# Patient Record
Sex: Male | Born: 1975
Health system: Southern US, Community
[De-identification: ages and names within clinical notes are randomized; demographics above are authoritative.]

## PROBLEM LIST (undated history)

## (undated) DIAGNOSIS — K649 Unspecified hemorrhoids: Secondary | ICD-10-CM

## (undated) DIAGNOSIS — F419 Anxiety disorder, unspecified: Secondary | ICD-10-CM

## (undated) HISTORY — DX: Unspecified hemorrhoids: K64.9

## (undated) HISTORY — DX: Anxiety disorder, unspecified: F41.9

---

## 2006-03-27 ENCOUNTER — Emergency Department (HOSPITAL_COMMUNITY): Admission: EM | Admit: 2006-03-27 | Discharge: 2006-03-27 | Payer: Self-pay | Admitting: Emergency Medicine

## 2012-08-16 ENCOUNTER — Encounter (INDEPENDENT_AMBULATORY_CARE_PROVIDER_SITE_OTHER): Payer: Self-pay | Admitting: General Surgery

## 2012-09-05 ENCOUNTER — Ambulatory Visit (INDEPENDENT_AMBULATORY_CARE_PROVIDER_SITE_OTHER): Payer: BC Managed Care – PPO | Admitting: General Surgery

## 2012-09-08 ENCOUNTER — Ambulatory Visit (INDEPENDENT_AMBULATORY_CARE_PROVIDER_SITE_OTHER): Payer: BC Managed Care – PPO | Admitting: General Surgery

## 2012-09-08 ENCOUNTER — Encounter (INDEPENDENT_AMBULATORY_CARE_PROVIDER_SITE_OTHER): Payer: Self-pay | Admitting: General Surgery

## 2012-09-08 VITALS — BP 119/88 | HR 86 | Temp 97.6°F | Resp 16 | Ht 75.0 in | Wt 217.0 lb

## 2012-09-08 DIAGNOSIS — L290 Pruritus ani: Secondary | ICD-10-CM

## 2012-09-08 NOTE — Patient Instructions (Signed)
Use hydrocortisone cream followed by aquaphor 2-3 times a day to rectum. Clean with baby wipes after bowel movements and reapply

## 2012-10-02 ENCOUNTER — Encounter (INDEPENDENT_AMBULATORY_CARE_PROVIDER_SITE_OTHER): Payer: BC Managed Care – PPO | Admitting: General Surgery

## 2012-10-09 NOTE — Progress Notes (Signed)
Subjective:     Patient ID: Marcus Marshall, male   DOB: Jun 14, 1976, 36 y.o.   MRN: [id number]  HPI We are asked to see the patient in consultation by Dr. Eula Listen to evaluate Marcus Marshall for hemorrhoids. The pt is a 36 yo wm who has been having rectal irritation for the last 2 years. He states that is comes and goes. He has noticed a lot of itching and burning and some occasional bleeding when he wipes.  Review of Systems  Constitutional: Negative.   HENT: Negative.   Eyes: Negative.   Respiratory: Negative.   Cardiovascular: Negative.   Gastrointestinal: Positive for anal bleeding and rectal pain.  Genitourinary: Negative.   Musculoskeletal: Negative.   Skin: Negative.   Neurological: Negative.   Hematological: Negative.   Psychiatric/Behavioral: Negative.        Objective:   Physical Exam  Constitutional: He is oriented to person, place, and time. He appears well-developed and well-nourished.  HENT:  Head: Normocephalic and atraumatic.  Eyes: Conjunctivae normal and EOM are normal. Pupils are equal, round, and reactive to light.  Neck: Normal range of motion. Neck supple.  Cardiovascular: Normal rate, regular rhythm and normal heart sounds.   Pulmonary/Chest: Effort normal and breath sounds normal.  Abdominal: Soft. Bowel sounds are normal.  Genitourinary:       He has mild external hemorrhoidal tissue but he does have significant redness and irritation of the perianal skin  Musculoskeletal: Normal range of motion.  Neurological: He is alert and oriented to person, place, and time.  Skin: Skin is warm and dry.  Psychiatric: He has a normal mood and affect. His behavior is normal.       Assessment:     The pt has what appears to be pruritis ani    Plan:     I would recommend using a steroid cream to the perianal skin 3 times a day. I would try to avoid a lot of scrubbing or itching of the rectum. I would also use baby wipes after bm's and reapply cream after cleaning

## 2014-08-13 ENCOUNTER — Other Ambulatory Visit: Payer: Self-pay | Admitting: Internal Medicine

## 2014-08-13 ENCOUNTER — Ambulatory Visit
Admission: RE | Admit: 2014-08-13 | Discharge: 2014-08-13 | Disposition: A | Discharge status: Home / Self Care | Payer: BC Managed Care – PPO | Source: Ambulatory Visit | Attending: Internal Medicine | Admitting: Internal Medicine

## 2014-08-13 DIAGNOSIS — M549 Dorsalgia, unspecified: Secondary | ICD-10-CM

## 2014-08-13 DIAGNOSIS — N50819 Testicular pain, unspecified: Secondary | ICD-10-CM

## 2014-08-19 ENCOUNTER — Ambulatory Visit
Admission: RE | Admit: 2014-08-19 | Discharge: 2014-08-19 | Disposition: A | Discharge status: Home / Self Care | Payer: BC Managed Care – PPO | Source: Ambulatory Visit | Attending: Internal Medicine | Admitting: Internal Medicine

## 2014-08-19 ENCOUNTER — Other Ambulatory Visit: Payer: Self-pay

## 2014-08-19 DIAGNOSIS — N50819 Testicular pain, unspecified: Secondary | ICD-10-CM

## 2016-03-24 DIAGNOSIS — Z113 Encounter for screening for infections with a predominantly sexual mode of transmission: Secondary | ICD-10-CM | POA: Diagnosis not present

## 2016-03-24 DIAGNOSIS — N419 Inflammatory disease of prostate, unspecified: Secondary | ICD-10-CM | POA: Diagnosis not present

## 2016-03-24 DIAGNOSIS — K602 Anal fissure, unspecified: Secondary | ICD-10-CM | POA: Diagnosis not present

## 2016-03-24 DIAGNOSIS — K6289 Other specified diseases of anus and rectum: Secondary | ICD-10-CM | POA: Diagnosis not present

## 2016-06-14 DIAGNOSIS — F4320 Adjustment disorder, unspecified: Secondary | ICD-10-CM | POA: Diagnosis not present

## 2016-06-29 DIAGNOSIS — F4320 Adjustment disorder, unspecified: Secondary | ICD-10-CM | POA: Diagnosis not present

## 2016-07-13 DIAGNOSIS — F4322 Adjustment disorder with anxiety: Secondary | ICD-10-CM | POA: Diagnosis not present

## 2016-07-22 DIAGNOSIS — F4322 Adjustment disorder with anxiety: Secondary | ICD-10-CM | POA: Diagnosis not present

## 2016-07-29 DIAGNOSIS — F4322 Adjustment disorder with anxiety: Secondary | ICD-10-CM | POA: Diagnosis not present

## 2016-08-04 DIAGNOSIS — F4322 Adjustment disorder with anxiety: Secondary | ICD-10-CM | POA: Diagnosis not present

## 2016-08-31 DIAGNOSIS — F4322 Adjustment disorder with anxiety: Secondary | ICD-10-CM | POA: Diagnosis not present

## 2016-09-02 DIAGNOSIS — F4322 Adjustment disorder with anxiety: Secondary | ICD-10-CM | POA: Diagnosis not present

## 2016-09-28 DIAGNOSIS — F4322 Adjustment disorder with anxiety: Secondary | ICD-10-CM | POA: Diagnosis not present

## 2016-11-01 DIAGNOSIS — F419 Anxiety disorder, unspecified: Secondary | ICD-10-CM | POA: Diagnosis not present

## 2016-11-01 DIAGNOSIS — K649 Unspecified hemorrhoids: Secondary | ICD-10-CM | POA: Diagnosis not present

## 2016-11-01 DIAGNOSIS — J019 Acute sinusitis, unspecified: Secondary | ICD-10-CM | POA: Diagnosis not present

## 2016-11-05 DIAGNOSIS — K602 Anal fissure, unspecified: Secondary | ICD-10-CM | POA: Diagnosis not present

## 2017-01-20 DIAGNOSIS — F4322 Adjustment disorder with anxiety: Secondary | ICD-10-CM | POA: Diagnosis not present

## 2017-01-25 DIAGNOSIS — F4322 Adjustment disorder with anxiety: Secondary | ICD-10-CM | POA: Diagnosis not present

## 2017-01-27 DIAGNOSIS — F1010 Alcohol abuse, uncomplicated: Secondary | ICD-10-CM | POA: Diagnosis not present

## 2017-01-27 DIAGNOSIS — F4322 Adjustment disorder with anxiety: Secondary | ICD-10-CM | POA: Diagnosis not present

## 2017-02-03 DIAGNOSIS — F4322 Adjustment disorder with anxiety: Secondary | ICD-10-CM | POA: Diagnosis not present

## 2017-02-15 DIAGNOSIS — F4322 Adjustment disorder with anxiety: Secondary | ICD-10-CM | POA: Diagnosis not present

## 2017-03-03 DIAGNOSIS — F4322 Adjustment disorder with anxiety: Secondary | ICD-10-CM | POA: Diagnosis not present

## 2017-03-29 DIAGNOSIS — F4322 Adjustment disorder with anxiety: Secondary | ICD-10-CM | POA: Diagnosis not present

## 2017-05-31 DIAGNOSIS — F411 Generalized anxiety disorder: Secondary | ICD-10-CM | POA: Diagnosis not present

## 2017-06-01 DIAGNOSIS — F411 Generalized anxiety disorder: Secondary | ICD-10-CM | POA: Diagnosis not present

## 2017-06-01 DIAGNOSIS — H532 Diplopia: Secondary | ICD-10-CM | POA: Diagnosis not present

## 2017-06-01 DIAGNOSIS — H5203 Hypermetropia, bilateral: Secondary | ICD-10-CM | POA: Diagnosis not present

## 2017-06-01 DIAGNOSIS — H4910 Fourth [trochlear] nerve palsy, unspecified eye: Secondary | ICD-10-CM | POA: Diagnosis not present

## 2017-06-01 DIAGNOSIS — N529 Male erectile dysfunction, unspecified: Secondary | ICD-10-CM | POA: Diagnosis not present

## 2017-06-07 DIAGNOSIS — H4911 Fourth [trochlear] nerve palsy, right eye: Secondary | ICD-10-CM | POA: Diagnosis not present

## 2017-06-07 DIAGNOSIS — H532 Diplopia: Secondary | ICD-10-CM | POA: Diagnosis not present

## 2017-06-29 DIAGNOSIS — F411 Generalized anxiety disorder: Secondary | ICD-10-CM | POA: Diagnosis not present

## 2017-06-29 DIAGNOSIS — F1010 Alcohol abuse, uncomplicated: Secondary | ICD-10-CM | POA: Diagnosis not present

## 2017-07-29 DIAGNOSIS — E7800 Pure hypercholesterolemia, unspecified: Secondary | ICD-10-CM | POA: Diagnosis not present

## 2017-07-29 DIAGNOSIS — Z0000 Encounter for general adult medical examination without abnormal findings: Secondary | ICD-10-CM | POA: Diagnosis not present

## 2017-07-29 DIAGNOSIS — Z125 Encounter for screening for malignant neoplasm of prostate: Secondary | ICD-10-CM | POA: Diagnosis not present

## 2017-08-04 DIAGNOSIS — F411 Generalized anxiety disorder: Secondary | ICD-10-CM | POA: Diagnosis not present

## 2017-08-04 DIAGNOSIS — F1010 Alcohol abuse, uncomplicated: Secondary | ICD-10-CM | POA: Diagnosis not present

## 2017-08-09 DIAGNOSIS — F411 Generalized anxiety disorder: Secondary | ICD-10-CM | POA: Diagnosis not present

## 2017-08-09 DIAGNOSIS — F1010 Alcohol abuse, uncomplicated: Secondary | ICD-10-CM | POA: Diagnosis not present

## 2017-12-06 DIAGNOSIS — J069 Acute upper respiratory infection, unspecified: Secondary | ICD-10-CM | POA: Diagnosis not present

## 2017-12-06 DIAGNOSIS — F17210 Nicotine dependence, cigarettes, uncomplicated: Secondary | ICD-10-CM | POA: Diagnosis not present

## 2018-02-01 DIAGNOSIS — Z113 Encounter for screening for infections with a predominantly sexual mode of transmission: Secondary | ICD-10-CM | POA: Diagnosis not present

## 2018-05-22 DIAGNOSIS — K529 Noninfective gastroenteritis and colitis, unspecified: Secondary | ICD-10-CM | POA: Diagnosis not present

## 2018-05-23 DIAGNOSIS — K529 Noninfective gastroenteritis and colitis, unspecified: Secondary | ICD-10-CM | POA: Diagnosis not present

## 2018-09-13 DIAGNOSIS — F411 Generalized anxiety disorder: Secondary | ICD-10-CM | POA: Diagnosis not present

## 2018-09-13 DIAGNOSIS — F1010 Alcohol abuse, uncomplicated: Secondary | ICD-10-CM | POA: Diagnosis not present

## 2018-10-26 DIAGNOSIS — Z1389 Encounter for screening for other disorder: Secondary | ICD-10-CM | POA: Diagnosis not present

## 2018-10-26 DIAGNOSIS — F17210 Nicotine dependence, cigarettes, uncomplicated: Secondary | ICD-10-CM | POA: Diagnosis not present

## 2018-10-26 DIAGNOSIS — Z0000 Encounter for general adult medical examination without abnormal findings: Secondary | ICD-10-CM | POA: Diagnosis not present

## 2018-10-26 DIAGNOSIS — F411 Generalized anxiety disorder: Secondary | ICD-10-CM | POA: Diagnosis not present

## 2018-10-26 DIAGNOSIS — Z13220 Encounter for screening for lipoid disorders: Secondary | ICD-10-CM | POA: Diagnosis not present

## 2018-11-02 DIAGNOSIS — F411 Generalized anxiety disorder: Secondary | ICD-10-CM | POA: Diagnosis not present

## 2018-11-02 DIAGNOSIS — E7800 Pure hypercholesterolemia, unspecified: Secondary | ICD-10-CM | POA: Diagnosis not present

## 2018-11-29 DIAGNOSIS — F1010 Alcohol abuse, uncomplicated: Secondary | ICD-10-CM | POA: Diagnosis not present

## 2018-11-29 DIAGNOSIS — F411 Generalized anxiety disorder: Secondary | ICD-10-CM | POA: Diagnosis not present

## 2019-01-12 DIAGNOSIS — R197 Diarrhea, unspecified: Secondary | ICD-10-CM | POA: Diagnosis not present

## 2019-01-12 DIAGNOSIS — Z1159 Encounter for screening for other viral diseases: Secondary | ICD-10-CM | POA: Diagnosis not present

## 2019-01-12 DIAGNOSIS — J029 Acute pharyngitis, unspecified: Secondary | ICD-10-CM | POA: Diagnosis not present

## 2019-01-12 DIAGNOSIS — M791 Myalgia, unspecified site: Secondary | ICD-10-CM | POA: Diagnosis not present

## 2019-02-07 DIAGNOSIS — R5383 Other fatigue: Secondary | ICD-10-CM | POA: Diagnosis not present

## 2019-05-01 DIAGNOSIS — E7800 Pure hypercholesterolemia, unspecified: Secondary | ICD-10-CM | POA: Diagnosis not present

## 2020-02-06 ENCOUNTER — Ambulatory Visit: Payer: Self-pay | Attending: Internal Medicine

## 2020-02-06 DIAGNOSIS — R509 Fever, unspecified: Secondary | ICD-10-CM | POA: Diagnosis not present

## 2020-02-06 DIAGNOSIS — J029 Acute pharyngitis, unspecified: Secondary | ICD-10-CM | POA: Diagnosis not present

## 2020-02-06 DIAGNOSIS — R05 Cough: Secondary | ICD-10-CM | POA: Diagnosis not present

## 2020-02-06 DIAGNOSIS — Z20822 Contact with and (suspected) exposure to covid-19: Secondary | ICD-10-CM | POA: Insufficient documentation

## 2020-02-07 LAB — NOVEL CORONAVIRUS, NAA: SARS-CoV-2, NAA: NOT DETECTED

## 2020-03-26 DIAGNOSIS — J060 Acute laryngopharyngitis: Secondary | ICD-10-CM | POA: Diagnosis not present

## 2020-03-26 DIAGNOSIS — R509 Fever, unspecified: Secondary | ICD-10-CM | POA: Diagnosis not present

## 2020-03-27 DIAGNOSIS — Z1152 Encounter for screening for COVID-19: Secondary | ICD-10-CM | POA: Diagnosis not present

## 2020-04-22 ENCOUNTER — Ambulatory Visit
Admission: RE | Admit: 2020-04-22 | Discharge: 2020-04-22 | Disposition: A | Discharge status: Home / Self Care | Payer: BLUE CROSS/BLUE SHIELD | Source: Ambulatory Visit | Attending: Physician Assistant | Admitting: Physician Assistant

## 2020-04-22 ENCOUNTER — Other Ambulatory Visit: Payer: Self-pay

## 2020-04-22 ENCOUNTER — Other Ambulatory Visit: Payer: Self-pay | Admitting: Physician Assistant

## 2020-04-22 DIAGNOSIS — R0781 Pleurodynia: Secondary | ICD-10-CM

## 2020-04-22 DIAGNOSIS — R079 Chest pain, unspecified: Secondary | ICD-10-CM | POA: Diagnosis not present

## 2020-04-22 DIAGNOSIS — R000 Tachycardia, unspecified: Secondary | ICD-10-CM | POA: Diagnosis not present

## 2020-04-24 ENCOUNTER — Emergency Department (HOSPITAL_COMMUNITY): Payer: BLUE CROSS/BLUE SHIELD

## 2020-04-24 ENCOUNTER — Inpatient Hospital Stay (HOSPITAL_COMMUNITY)
Admission: EM | Admit: 2020-04-24 | Discharge: 2020-04-26 | DRG: 176 | Disposition: A | Discharge status: Home / Self Care | Payer: BLUE CROSS/BLUE SHIELD | Attending: Internal Medicine | Admitting: Internal Medicine

## 2020-04-24 ENCOUNTER — Other Ambulatory Visit: Payer: Self-pay

## 2020-04-24 ENCOUNTER — Encounter (HOSPITAL_COMMUNITY): Payer: Self-pay

## 2020-04-24 DIAGNOSIS — I358 Other nonrheumatic aortic valve disorders: Secondary | ICD-10-CM | POA: Diagnosis not present

## 2020-04-24 DIAGNOSIS — F419 Anxiety disorder, unspecified: Secondary | ICD-10-CM | POA: Diagnosis not present

## 2020-04-24 DIAGNOSIS — J90 Pleural effusion, not elsewhere classified: Secondary | ICD-10-CM | POA: Diagnosis present

## 2020-04-24 DIAGNOSIS — Z79899 Other long term (current) drug therapy: Secondary | ICD-10-CM

## 2020-04-24 DIAGNOSIS — Z8249 Family history of ischemic heart disease and other diseases of the circulatory system: Secondary | ICD-10-CM | POA: Diagnosis not present

## 2020-04-24 DIAGNOSIS — I2699 Other pulmonary embolism without acute cor pulmonale: Principal | ICD-10-CM | POA: Diagnosis present

## 2020-04-24 DIAGNOSIS — I2602 Saddle embolus of pulmonary artery with acute cor pulmonale: Secondary | ICD-10-CM | POA: Diagnosis not present

## 2020-04-24 DIAGNOSIS — Z20822 Contact with and (suspected) exposure to covid-19: Secondary | ICD-10-CM | POA: Diagnosis present

## 2020-04-24 DIAGNOSIS — Z87891 Personal history of nicotine dependence: Secondary | ICD-10-CM | POA: Diagnosis not present

## 2020-04-24 DIAGNOSIS — R079 Chest pain, unspecified: Secondary | ICD-10-CM | POA: Diagnosis not present

## 2020-04-24 DIAGNOSIS — B948 Sequelae of other specified infectious and parasitic diseases: Secondary | ICD-10-CM | POA: Diagnosis not present

## 2020-04-24 DIAGNOSIS — Z114 Encounter for screening for human immunodeficiency virus [HIV]: Secondary | ICD-10-CM

## 2020-04-24 LAB — CBC
HCT: 46.2 % (ref 39.0–52.0)
Hemoglobin: 16.5 g/dL (ref 13.0–17.0)
MCH: 32.2 pg (ref 26.0–34.0)
MCHC: 35.7 g/dL (ref 30.0–36.0)
MCV: 90.1 fL (ref 80.0–100.0)
Platelets: 235 10*3/uL (ref 150–400)
RBC: 5.13 MIL/uL (ref 4.22–5.81)
RDW: 11.4 % — ABNORMAL LOW (ref 11.5–15.5)
WBC: 8.9 10*3/uL (ref 4.0–10.5)
nRBC: 0 % (ref 0.0–0.2)

## 2020-04-24 LAB — BASIC METABOLIC PANEL
Anion gap: 16 — ABNORMAL HIGH (ref 5–15)
BUN: 14 mg/dL (ref 6–20)
CO2: 24 mmol/L (ref 22–32)
Calcium: 9 mg/dL (ref 8.9–10.3)
Chloride: 97 mmol/L — ABNORMAL LOW (ref 98–111)
Creatinine, Ser: 1.13 mg/dL (ref 0.61–1.24)
GFR calc Af Amer: >60 mL/min (ref >60)
GFR calc non Af Amer: >60 mL/min (ref >60)
Glucose, Bld: 108 mg/dL — ABNORMAL HIGH (ref 70–99)
Potassium: 3.9 mmol/L (ref 3.5–5.1)
Sodium: 137 mmol/L (ref 135–145)

## 2020-04-24 LAB — TROPONIN I (HIGH SENSITIVITY): Troponin I (High Sensitivity): <2 ng/L (ref <18)

## 2020-04-24 MED ORDER — SODIUM CHLORIDE 0.9% FLUSH: 3.0000 mL | Freq: Once | INTRAVENOUS | Status: DC

## 2020-04-24 MED ORDER — IOHEXOL 350 MG/ML SOLN
75.0000 mL | Freq: Once | INTRAVENOUS | Status: AC | PRN
  Administered 2020-04-24: 75 mL via INTRAVENOUS

## 2020-04-24 NOTE — ED Triage Notes (Signed)
Pt arrives to ED w/ c/o 7/10 R sided chest pain and sob since Monday. Pt reports coughing up blood over the last few days.

## 2020-04-25 ENCOUNTER — Inpatient Hospital Stay (HOSPITAL_COMMUNITY): Payer: BLUE CROSS/BLUE SHIELD

## 2020-04-25 ENCOUNTER — Encounter (HOSPITAL_COMMUNITY): Payer: Self-pay | Admitting: Internal Medicine

## 2020-04-25 ENCOUNTER — Other Ambulatory Visit: Payer: Self-pay

## 2020-04-25 DIAGNOSIS — I358 Other nonrheumatic aortic valve disorders: Secondary | ICD-10-CM | POA: Diagnosis present

## 2020-04-25 DIAGNOSIS — I2602 Saddle embolus of pulmonary artery with acute cor pulmonale: Secondary | ICD-10-CM

## 2020-04-25 DIAGNOSIS — Z20822 Contact with and (suspected) exposure to covid-19: Secondary | ICD-10-CM | POA: Diagnosis present

## 2020-04-25 DIAGNOSIS — Z79899 Other long term (current) drug therapy: Secondary | ICD-10-CM | POA: Diagnosis not present

## 2020-04-25 DIAGNOSIS — Z87891 Personal history of nicotine dependence: Secondary | ICD-10-CM | POA: Diagnosis not present

## 2020-04-25 DIAGNOSIS — I2699 Other pulmonary embolism without acute cor pulmonale: Secondary | ICD-10-CM | POA: Diagnosis present

## 2020-04-25 DIAGNOSIS — B948 Sequelae of other specified infectious and parasitic diseases: Secondary | ICD-10-CM | POA: Diagnosis not present

## 2020-04-25 DIAGNOSIS — J90 Pleural effusion, not elsewhere classified: Secondary | ICD-10-CM | POA: Diagnosis present

## 2020-04-25 DIAGNOSIS — F419 Anxiety disorder, unspecified: Secondary | ICD-10-CM | POA: Diagnosis present

## 2020-04-25 DIAGNOSIS — Z8249 Family history of ischemic heart disease and other diseases of the circulatory system: Secondary | ICD-10-CM | POA: Diagnosis not present

## 2020-04-25 DIAGNOSIS — Z114 Encounter for screening for human immunodeficiency virus [HIV]: Secondary | ICD-10-CM

## 2020-04-25 LAB — HEPARIN LEVEL (UNFRACTIONATED)
Heparin Unfractionated: 0.41 IU/mL (ref 0.30–0.70)
Heparin Unfractionated: 0.41 IU/mL (ref 0.30–0.70)

## 2020-04-25 LAB — CBC
HCT: 45.5 % (ref 39.0–52.0)
Hemoglobin: 16.4 g/dL (ref 13.0–17.0)
MCH: 32.3 pg (ref 26.0–34.0)
MCHC: 36 g/dL (ref 30.0–36.0)
MCV: 89.6 fL (ref 80.0–100.0)
Platelets: 231 10*3/uL (ref 150–400)
RBC: 5.08 MIL/uL (ref 4.22–5.81)
RDW: 11.5 % (ref 11.5–15.5)
WBC: 8.1 10*3/uL (ref 4.0–10.5)
nRBC: 0 % (ref 0.0–0.2)

## 2020-04-25 LAB — SARS CORONAVIRUS 2 BY RT PCR (HOSPITAL ORDER, PERFORMED IN ~~LOC~~ HOSPITAL LAB): SARS Coronavirus 2: NEGATIVE

## 2020-04-25 LAB — ECHOCARDIOGRAM COMPLETE
Height: 76 in
Weight: 3680 oz

## 2020-04-25 LAB — HIV ANTIBODY (ROUTINE TESTING W REFLEX): HIV Screen 4th Generation wRfx: NONREACTIVE

## 2020-04-25 LAB — TROPONIN I (HIGH SENSITIVITY): Troponin I (High Sensitivity): <2 ng/L (ref <18)

## 2020-04-25 LAB — BRAIN NATRIURETIC PEPTIDE: B Natriuretic Peptide: 31.8 pg/mL (ref 0.0–100.0)

## 2020-04-25 MED ORDER — ONDANSETRON HCL 4 MG/2ML IJ SOLN
4.0000 mg | Freq: Once | INTRAMUSCULAR | Status: AC
  Administered 2020-04-25: 4 mg via INTRAVENOUS
  Filled 2020-04-25: qty 2

## 2020-04-25 MED ORDER — MORPHINE SULFATE (PF) 4 MG/ML IV SOLN
4.0000 mg | Freq: Once | INTRAVENOUS | Status: AC
  Administered 2020-04-25: 4 mg via INTRAVENOUS
  Filled 2020-04-25: qty 1

## 2020-04-25 MED ORDER — HEPARIN BOLUS VIA INFUSION
7000.0000 [IU] | Freq: Once | INTRAVENOUS | Status: AC
  Administered 2020-04-25: 7000 [IU] via INTRAVENOUS
  Filled 2020-04-25: qty 7000

## 2020-04-25 MED ORDER — GUAIFENESIN-CODEINE 100-10 MG/5ML PO SOLN
5.0000 mL | ORAL | Status: DC | PRN
  Administered 2020-04-26: 5 mL via ORAL
  Filled 2020-04-25 (×2): qty 5

## 2020-04-25 MED ORDER — MORPHINE SULFATE (PF) 2 MG/ML IV SOLN
1.0000 mg | INTRAVENOUS | Status: DC | PRN
  Administered 2020-04-25: 1 mg via INTRAVENOUS
  Filled 2020-04-25: qty 1

## 2020-04-25 MED ORDER — KETOROLAC TROMETHAMINE 30 MG/ML IJ SOLN
30.0000 mg | Freq: Four times a day (QID) | INTRAMUSCULAR | Status: DC | PRN
  Administered 2020-04-25 – 2020-04-26 (×3): 30 mg via INTRAVENOUS
  Filled 2020-04-25 (×3): qty 1

## 2020-04-25 MED ORDER — ACETAMINOPHEN 650 MG RE SUPP: 650.0000 mg | Freq: Four times a day (QID) | RECTAL | Status: DC | PRN

## 2020-04-25 MED ORDER — ACETAMINOPHEN 325 MG PO TABS: 650.0000 mg | ORAL_TABLET | Freq: Four times a day (QID) | ORAL | Status: DC | PRN

## 2020-04-25 MED ORDER — MORPHINE SULFATE (PF) 2 MG/ML IV SOLN
2.0000 mg | INTRAVENOUS | Status: DC | PRN
  Administered 2020-04-25 (×2): 2 mg via INTRAVENOUS
  Filled 2020-04-25 (×2): qty 1

## 2020-04-25 MED ORDER — HEPARIN (PORCINE) 25000 UT/250ML-% IV SOLN
1950.0000 [IU]/h | INTRAVENOUS | Status: DC
  Administered 2020-04-25: 1900 [IU]/h via INTRAVENOUS
  Administered 2020-04-25: 1850 [IU]/h via INTRAVENOUS
  Filled 2020-04-25 (×3): qty 250

## 2020-04-25 NOTE — ED Provider Notes (Signed)
Patient is 44 year old male presented today with chest pain shortness of breath.  I was called by triage nurse who is requesting CT PE study.  After discussion with nurse and review of patient's lab work and vital signs I ordered CT PE from waiting room. I discussed this with Dr. Dalene Seltzer who recommended PE study be ordered.   12:18 AM called by radiology who informed that patient has bilateral PEs with evidence of right heart strain.  Discussed with [facility].  Patient will be brought back to room as next patient.     Solon Augusta [place], [place] 04/25/20 4332    Alvira Monday, MD 04/25/20 1329

## 2020-04-25 NOTE — Progress Notes (Signed)
°  Echocardiogram 2D Echocardiogram has been performed.  Marcus Marshall 04/25/2020, 4:36 PM

## 2020-04-25 NOTE — ED Notes (Signed)
Lunch Tray Ordered @ 1048. °

## 2020-04-25 NOTE — ED Notes (Signed)
Pt requesting copy of CT scan. CT notified of pt request.

## 2020-04-25 NOTE — TOC Benefit Eligibility Note (Signed)
Transition of Care [facility]) Benefit Eligibility Note    Patient Details  Name: Marcus Marshall MRN: [id number] Date of Birth: 1975-12-31   Medication/Dose: Carlena Hurl  15 MG BID  CO-PAY- $35.00   XARELTO 20 MG DAILY  CO-PAY-$35.00  (  RIVAROXABAN: NON-FORMULARY  )  Covered?: Yes  Tier: 2 Drug  Prescription Coverage [facility]erred [facility]acy: [facility]  and   [facility]  M/O    ( 90 DAY SUPPLY FOR RETAIL OR M/O $105.00  )  Spoke with Person/Company/Phone Number:: RHONDA  @ [employer] RX # [phone]  Co-Pay: $35.00  Prior Approval: No  Deductible: (NO DEDUCTIBLE and NO OUT-OF-POCKET)  Additional Notes: ELIQUIS  2.5 MG BID  CO-PAY-$35.00   ELIQUIS 5 MG BID CO-PAY- $35.00        APIXABAN   and  ELIQUIS 10 MG BID- NON-FORMULARY    Mardene Sayer Phone Number: 04/25/2020, 4:45 PM

## 2020-04-25 NOTE — ED Provider Notes (Signed)
[facility] EMERGENCY DEPARTMENT Provider Note   CSN: [id number] Arrival date & time: 04/24/20  2113     History Chief Complaint  Patient presents with  . Chest Pain    Marcus Marshall is a 44 y.o. male.  Patient presents to the emergency department for evaluation of chest pain.  Patient reports that he was diagnosed with Covid on May 9.  He reports that he has had a rough course with the Covid.  He saw his primary doctor earlier this week because he was experiencing chest pain and cough.  He had an outpatient x-ray that showed an effusion.  He was given pain medication for the chest pain and this has not helped.  Pain worsened tonight he has noticed that he is coughing up blood so he presents to the ER.         Past Medical History:  Diagnosis Date  . Anxiety   . Hemorrhoids     Patient Active Problem List   Diagnosis Date Noted  . Pulmonary embolism ([place]) 04/25/2020  . Pruritus ani 09/08/2012    History reviewed. No pertinent surgical history.     No family history on file.  Social History   Tobacco Use  . Smoking status: Former Smoker  Substance Use Topics  . Alcohol use: Yes    Comment: 6 pack per week  . Drug use: No    Home Medications Prior to Admission medications   Medication Sig Start Date End Date Taking? Authorizing Provider  ALPRAZolam Duanne Moron) 0.5 MG tablet Take 0.5 mg by mouth at bedtime as needed.    [provider]    Allergies    Patient has no known allergies.  Review of Systems   Review of Systems  Respiratory: Positive for cough and shortness of breath.   Cardiovascular: Positive for chest pain.  All other systems reviewed and are negative.   Physical Exam Updated Vital Signs BP (!) 142/86 (BP Location: Right Arm)   Pulse 100   Temp 98.7 F (37.1 C) (Oral)   Resp (!) 27   Ht 6\' 4"  (1.93 m)   Wt 104.3 kg   SpO2 97%   BMI 28.00 kg/m   Physical Exam Vitals and nursing note reviewed.    Constitutional:      General: He is not in acute distress.    Appearance: Normal appearance. He is well-developed.  HENT:     Head: Normocephalic and atraumatic.     Right Ear: Hearing normal.     Left Ear: Hearing normal.     Nose: Nose normal.  Eyes:     Conjunctiva/sclera: Conjunctivae normal.     Pupils: Pupils are equal, round, and reactive to light.  Cardiovascular:     Rate and Rhythm: Regular rhythm. Tachycardia present.     Heart sounds: S1 normal and S2 normal. No murmur. No friction rub. No gallop.   Pulmonary:     Effort: Pulmonary effort is normal. No respiratory distress.     Breath sounds: Normal breath sounds.  Chest:     Chest wall: No tenderness.  Abdominal:     General: Bowel sounds are normal.     Palpations: Abdomen is soft.     Tenderness: There is no abdominal tenderness. There is no guarding or rebound. Negative signs include Murphy's sign and McBurney's sign.     Hernia: No hernia is present.  Musculoskeletal:        General: Normal range of motion.  Cervical back: Normal range of motion and neck supple.  Skin:    General: Skin is warm and dry.     Findings: No rash.  Neurological:     Mental Status: He is alert and oriented to person, place, and time.     GCS: GCS eye subscore is 4. GCS verbal subscore is 5. GCS motor subscore is 6.     Cranial Nerves: No cranial nerve deficit.     Sensory: No sensory deficit.     Coordination: Coordination normal.  Psychiatric:        Speech: Speech normal.        Behavior: Behavior normal.        Thought Content: Thought content normal.     ED Results / Procedures / Treatments   Labs (all labs ordered are listed, but only abnormal results are displayed) Labs Reviewed  BASIC METABOLIC PANEL - Abnormal; Notable for the following components:      Result Value   Chloride 97 (*)    Glucose, Bld 108 (*)    Anion gap 16 (*)    All other components within normal limits  CBC - Abnormal; Notable for the  following components:   RDW 11.4 (*)    All other components within normal limits  SARS CORONAVIRUS 2 BY RT PCR (HOSPITAL ORDER, PERFORMED IN [facility])  BRAIN NATRIURETIC PEPTIDE  ANTITHROMBIN III  PROTEIN C ACTIVITY  PROTEIN C, TOTAL  PROTEIN S ACTIVITY  PROTEIN S, TOTAL  LUPUS ANTICOAGULANT PANEL  BETA-2-GLYCOPROTEIN I ABS, IGG/M/A  HOMOCYSTEINE  FACTOR 5 LEIDEN  PROTHROMBIN GENE MUTATION  CARDIOLIPIN ANTIBODIES, IGG, IGM, IGA  HEPARIN LEVEL (UNFRACTIONATED)  CBC  TROPONIN I (HIGH SENSITIVITY)  TROPONIN I (HIGH SENSITIVITY)    EKG None  Radiology DG Chest 2 View  Result Date: 04/24/2020 CLINICAL DATA:  Chest pain EXAM: CHEST - 2 VIEW COMPARISON:  04/22/2020 FINDINGS: Increased density at the left lung base. Minimal increased density at the right lung base. No pleural effusion or pneumothorax stable cardiomediastinal contours with heart size. IMPRESSION: Increased atelectasis/consolidation at the left much greater than right lung base. Electronically Signed   By: Guadlupe Spanish M.D.   On: 04/24/2020 21:55   CT Angio Chest PE W/Cm &/Or Wo Cm  Result Date: 04/25/2020 CLINICAL DATA:  43 year old male with concern for pulmonary embolism. EXAM: CT ANGIOGRAPHY CHEST WITH CONTRAST TECHNIQUE: Multidetector CT imaging of the chest was performed using the standard protocol during bolus administration of intravenous contrast. Multiplanar CT image reconstructions and MIPs were obtained to evaluate the vascular anatomy. CONTRAST:  66mL OMNIPAQUE IOHEXOL 350 MG/ML SOLN COMPARISON:  Chest radiograph dated 04/24/2020 FINDINGS: Cardiovascular: There is no cardiomegaly or pericardial effusion. The thoracic aorta is unremarkable. Evaluation of the pulmonary arteries is very limited due to respiratory motion artifact and suboptimal opacification and timing of the contrast. Bilateral lower lobe pulmonary artery emboli involving the lobar and segmental branches noted. There is dilatation of  the right ventricle with the right ventricular lumen measuring 4.3 cm and the left ventricular lumen measuring 3.2 cm. The RV/LV ratio is 1.3. Mediastinum/Nodes: No hilar or mediastinal adenopathy. The esophagus and the thyroid gland are grossly unremarkable. No mediastinal fluid collection. Lungs/Pleura: Patchy and streaky density at the left lung base and to a lesser degree involving the right lung base may represent atelectasis/scarring or areas of infarct. There is a small left pleural effusion. No pneumothorax. The central airways are patent. Upper Abdomen: Probable fatty infiltration of the  liver. A subcentimeter right hepatic hypodense focus is too small to characterize. Musculoskeletal: No chest wall abnormality. No acute or significant osseous findings. Review of the MIP images confirms the above findings. IMPRESSION: 1. Bilateral lower lobe pulmonary artery emboli with CT evidence of right heart strain (RV/LV Ratio = 1.3) consistent with at least submassive (intermediate risk) PE. The presence of right heart strain has been associated with an increased risk of morbidity and mortality. 2. Small left pleural effusion. 3. Patchy and streaky density at the left lung base and to a lesser degree involving the right lung base may represent atelectasis/scarring or areas of infarct. These results were called by telephone at the time of interpretation on 04/25/2020 at 12:03 am to provider [facility] , who verbally acknowledged these results. Electronically Signed   By: Elgie Collard M.D.   On: 04/25/2020 00:06    Procedures Procedures (including critical care time)  Medications Ordered in ED Medications  sodium chloride flush (NS) 0.9 % injection 3 mL (3 mLs Intravenous Not Given 04/25/20 0107)  heparin bolus via infusion 7,000 Units (has no administration in time range)    Followed by  heparin ADULT infusion 100 units/mL (25000 units/23mL sodium chloride 0.45%) (has no administration in time range)    iohexol (OMNIPAQUE) 350 MG/ML injection 75 mL (75 mLs Intravenous Contrast Given 04/24/20 2331)    ED Course  I have reviewed the triage vital signs and the nursing notes.  Pertinent labs & imaging results that were available during my care of the patient were reviewed by me and considered in my medical decision making (see chart for details).    MDM Rules/Calculators/A&P                      Patient presents to the emergency department for evaluation for chest pain, shortness of breath and hematemesis.  Patient was diagnosed on May 9 with Covid.  He has successfully been through the quarantine process and actually has received his second vaccination after diagnosis.  Over the last couple of days he has had increasing chest pain, shortness of breath and now has been coughing up blood.  CT scan shows bilateral PE with right heart strain.  He has slight tachycardia here but vital signs are otherwise unremarkable.  Initiate heparinization and admit patient for further management.  CRITICAL CARE Performed by: Gilda Crease   Total critical care time: 30 minutes  Critical care time was exclusive of separately billable procedures and treating other patients.  Critical care was necessary to treat or prevent imminent or life-threatening deterioration.  Critical care was time spent personally by me on the following activities: development of treatment plan with patient and/or surrogate as well as nursing, discussions with consultants, evaluation of patient's response to treatment, examination of patient, obtaining history from patient or surrogate, ordering and performing treatments and interventions, ordering and review of laboratory studies, ordering and review of radiographic studies, pulse oximetry and re-evaluation of patient's condition.   Final Clinical Impression(s) / ED Diagnoses Final diagnoses:  PE (pulmonary thromboembolism) (HCC)    Rx / DC Orders ED Discharge Orders     None       Corbyn Wildey, Canary Brim, MD 04/25/20 [phone]

## 2020-04-25 NOTE — ED Notes (Signed)
Ordered breakfast--Marcus Marshall 

## 2020-04-25 NOTE — Progress Notes (Signed)
Patient is a 44 year old male with history of anxiety, recent Covid infection in March of this year who presented to the emergency part with complaints of chest pain, shortness of breath, hemoptysis.  He reported 4 days history of pleuritic chest pain associated with shortness of breath and cough.  He is usually healthy person and does not take any medications. On presentation he was found to be tachycardic, tachypneic.  Blood pressure was stable.  Not hypoxic.  CT angiogram showed bilateral lower lobe pulmonary artery emboli with CT evidence of right heart strain consistent with at least submassive PE. He was started on heparin drip.  This PE is most likely associated  with recent Covid infection.  Hypercoagulable disorder panel also sent. Patient seen and examined at the bedside this morning.  He was complaining of left-sided chest pain and cough.  His lungs were clear on auscultation.  He was saturating normally on room air. Our plan is to continue IV heparin today.  Will do physical therapy evaluation tomorrow.  If he remains hemodynamically stable tomorrow, we might consider discharging him on Eliquis. Venous duplex did not show any DVT.  Echocardiogram is pending. Patient seen by Dr. Loney Loh this morning.  I agree with her assessment and plan.

## 2020-04-25 NOTE — Progress Notes (Signed)
ANTICOAGULATION CONSULT NOTE - Initial Consult  Pharmacy Consult for heparin Indication: pulmonary embolus  No Known Allergies  Patient Measurements: Height: 6\' 4"  (193 cm) Weight: 104.3 kg (230 lb) IBW/kg (Calculated) : 86.8 Heparin Dosing Weight: 104 kg  Vital Signs: Temp: 98.7 F (37.1 C) (06/04 1438) Temp Source: Oral (06/04 1438) BP: 120/74 (06/04 1400) Pulse Rate: 91 (06/04 1438)  Labs: Recent Labs    04/24/20 2140 04/24/20 2359 04/25/20 0130 04/25/20 0621 04/25/20 1434  HGB 16.5  --  16.4  --   --   HCT 46.2  --  45.5  --   --   PLT 235  --  231  --   --   HEPARINUNFRC  --   --   --  0.41 0.41  CREATININE 1.13  --   --   --   --   TROPONINIHS <2 <2  --   --   --     Estimated Creatinine Clearance: 114.1 mL/min (by C-G formula based on SCr of 1.13 mg/dL).  Assessment: 44 yo M with acute BL LL PE with RHS likely due to COVID infection in March.  No AC PTA.   HL Stable at 0.41. H/H, plt stable. Will increase to target higher end of goal given low bleed risk and high clot burden.    Goal of Therapy:  Heparin level 0.3-0.7 units/ml Monitor platelets by anticoagulation protocol: Yes  Plan: Increase heparin gtt to 1950 units/hr   Monitor daily HL, CBC/plt Monitor for signs/symptoms of bleeding  F/u plans for oral [facility]  [facility], PharmD, BCPS, BCCP Clinical Pharmacist  Please check AMION for all [facility] Pharmacy phone numbers After 10:00 PM, call Main Pharmacy [phone]

## 2020-04-25 NOTE — Progress Notes (Signed)
Bilateral lower extremity venous duplex has been completed. Preliminary results can be found in CV Proc through chart review.   04/25/20 9:53 AM Olen Cordial RVT

## 2020-04-25 NOTE — ED Notes (Signed)
Pt reported having chest and back pain 7 out of 10

## 2020-04-25 NOTE — Progress Notes (Signed)
ANTICOAGULATION CONSULT NOTE - Initial Consult  Pharmacy Consult for heparin Indication: pulmonary embolus  No Known Allergies  Patient Measurements: Height: 6\' 4"  (193 cm) Weight: 104.3 kg (230 lb) IBW/kg (Calculated) : 86.8 Heparin Dosing Weight: 104 kg  Vital Signs: Temp: 98.5 F (36.9 C) (06/04 0645) Temp Source: Oral (06/04 0105) BP: 121/86 (06/04 0700) Pulse Rate: 76 (06/04 0700)  Labs: Recent Labs    04/24/20 2140 04/24/20 2359 04/25/20 0130 04/25/20 0621  HGB 16.5  --  16.4  --   HCT 46.2  --  45.5  --   PLT 235  --  231  --   HEPARINUNFRC  --   --   --  0.41  CREATININE 1.13  --   --   --   TROPONINIHS <2 <2  --   --     Estimated Creatinine Clearance: 114.1 mL/min (by C-G formula based on SCr of 1.13 mg/dL).  Assessment: CC/HPI: 44 yo m presenting with CP and SOB  Anticoag: none pta - iv hep for PE. Initial HL is therapeutic at 0.41  Renal: SCr 1.13  Pulm: CTA BL LL PE with RV/LV ratio of 1.3 Infarct in LL base  Heme/Onc: Stable and wnl   Goal of Therapy:  Heparin level 0.3-0.7 units/ml Monitor platelets by anticoagulation protocol: Yes  Plan: Increase heparin gtt slightly to 1900 units/hr given PE severity  F/u cHL in 6 hours  Daily Hep lvl cbc F/u plans for oral [facility]  [facility], PharmD., BCPS, BCCCP Clinical Pharmacist Clinical phone for 04/25/20 until 3:30pm: [phone] If after 3:30pm, please refer to [facility] for unit-specific pharmacist

## 2020-04-25 NOTE — H&P (Signed)
History and Physical    Marcus Marshall DJT:[id number] DOB: [birth year] DOA: 04/24/2020  PCP: Georgann Housekeeper, MD Patient coming from: Home  Chief Complaint: Chest pain, shortness of breath, hemoptysis  HPI: Marcus Marshall is a 44 y.o. male with medical history significant of anxiety, recent Covid infection presenting with complaints of chest pain, shortness of breath, and hemoptysis.  Patient reports 4-day history of pleuritic chest pain and shortness of breath.  He is having pain in his left lower chest for which he was seen by his PCP and an x-ray was done.  He was told that it showed a "pleural effusion."  Also having right-sided pleuritic chest pain.  States he is very uncomfortable secondary to pain and taking very shallow breaths.  Patient states his symptoms have continued to worsen.  Last 2 days he is coughing up blood.  Denies prior history of blood clots.  Denies any calf pain or swelling.  Denies recent long distance travel.  Denies personal or family history of clotting disorder.  States he had Covid infection last month.  His symptoms started on 5/6 and he tested positive for Covid on 5/9.  He was sick for about a week at that time.  ED Course: Slightly tachycardic and tachypneic.  Not hypotensive.  Not hypoxic.  High-sensitivity troponin checked twice negative.  BNP normal.  Panel for work-up of underlying hypercoagulable disorder pending.  Chest x-ray personally reviewed showing increased density at the left lung base.  CT angiogram chest showing bilateral lower lobe pulmonary artery emboli with CT evidence of right heart strain (RV/LV ratio = 1.3) consistent with at least submassive (intermediate risk) PE.  Small left pleural effusion.  Patchy and streaky density at the left lung base and to a lesser degree involving the right lung base felt to represent atelectasis/scarring or areas of infarct.  Heparin bolus and infusion started.  Review of Systems:  All systems reviewed and apart from  history of presenting illness, are negative.  Past Medical History:  Diagnosis Date  . Anxiety   . Hemorrhoids     History reviewed. No pertinent surgical history.   reports that he has quit smoking. He does not have any smokeless tobacco history on file. He reports current alcohol use. He reports that he does not use drugs.  No Known Allergies  Family History  Problem Relation Age of Onset  . CAD Father     Prior to Admission medications   Medication Sig Start Date End Date Taking? Authorizing Provider  ALPRAZolam Prudy Feeler) 0.5 MG tablet Take 0.5 mg by mouth at bedtime as needed.    [provider]    Physical Exam: Vitals:   04/25/20 0145 04/25/20 0200 04/25/20 0215 04/25/20 0230  BP: (!) 123/93 125/88 (!) 133/98 133/89  Pulse: 93 96 90 90  Resp: (!) 29 (!) 30 (!) 27 19  Temp:      TempSrc:      SpO2: 94% 95% 96% 94%  Weight:      Height:        Physical Exam  Constitutional: He is oriented to person, place, and time. He appears well-developed and well-nourished. No distress.  HENT:  Head: Normocephalic.  Eyes: Right eye exhibits no discharge. Left eye exhibits no discharge.  Cardiovascular: Normal rate, regular rhythm and intact distal pulses.  Pulmonary/Chest: He has no wheezes. He has no rales.  Tachypneic with respiratory rate up to mid 20s Oxygen saturation in the mid 90s on room air  Abdominal: Soft. Bowel  sounds are normal. He exhibits no distension. There is no abdominal tenderness. There is no guarding.  Musculoskeletal:        General: No edema.     Cervical back: Neck supple.  Neurological: He is alert and oriented to person, place, and time.  Skin: Skin is warm and dry. He is not diaphoretic.    Labs on Admission: I have personally reviewed following labs and imaging studies  CBC: Recent Labs  Lab 04/24/20 2140 04/25/20 0130  WBC 8.9 8.1  HGB 16.5 16.4  HCT 46.2 45.5  MCV 90.1 89.6  PLT 235 231   Basic Metabolic Panel: Recent  Labs  Lab 04/24/20 2140  NA 137  K 3.9  CL 97*  CO2 24  GLUCOSE 108*  BUN 14  CREATININE 1.13  CALCIUM 9.0   GFR: Estimated Creatinine Clearance: 114.1 mL/min (by C-G formula based on SCr of 1.13 mg/dL). Liver Function Tests: No results for input(s): AST, ALT, ALKPHOS, BILITOT, PROT, ALBUMIN in the last 168 hours. No results for input(s): LIPASE, AMYLASE in the last 168 hours. No results for input(s): AMMONIA in the last 168 hours. Coagulation Profile: No results for input(s): INR, PROTIME in the last 168 hours. Cardiac Enzymes: No results for input(s): CKTOTAL, CKMB, CKMBINDEX, TROPONINI in the last 168 hours. BNP (last 3 results) No results for input(s): PROBNP in the last 8760 hours. HbA1C: No results for input(s): HGBA1C in the last 72 hours. CBG: No results for input(s): GLUCAP in the last 168 hours. Lipid Profile: No results for input(s): CHOL, HDL, LDLCALC, TRIG, CHOLHDL, LDLDIRECT in the last 72 hours. Thyroid Function Tests: No results for input(s): TSH, T4TOTAL, FREET4, T3FREE, THYROIDAB in the last 72 hours. Anemia Panel: No results for input(s): VITAMINB12, FOLATE, FERRITIN, TIBC, IRON, RETICCTPCT in the last 72 hours. Urine analysis: No results found for: COLORURINE, APPEARANCEUR, LABSPEC, PHURINE, GLUCOSEU, HGBUR, BILIRUBINUR, KETONESUR, PROTEINUR, UROBILINOGEN, NITRITE, LEUKOCYTESUR  Radiological Exams on Admission: DG Chest 2 View  Result Date: 04/24/2020 CLINICAL DATA:  Chest pain EXAM: CHEST - 2 VIEW COMPARISON:  04/22/2020 FINDINGS: Increased density at the left lung base. Minimal increased density at the right lung base. No pleural effusion or pneumothorax stable cardiomediastinal contours with heart size. IMPRESSION: Increased atelectasis/consolidation at the left much greater than right lung base. Electronically Signed   By: Guadlupe Spanish M.D.   On: 04/24/2020 21:55   CT Angio Chest PE W/Cm &/Or Wo Cm  Result Date: 04/25/2020 CLINICAL DATA:   44 year old male with concern for pulmonary embolism. EXAM: CT ANGIOGRAPHY CHEST WITH CONTRAST TECHNIQUE: Multidetector CT imaging of the chest was performed using the standard protocol during bolus administration of intravenous contrast. Multiplanar CT image reconstructions and MIPs were obtained to evaluate the vascular anatomy. CONTRAST:  81mL OMNIPAQUE IOHEXOL 350 MG/ML SOLN COMPARISON:  Chest radiograph dated 04/24/2020 FINDINGS: Cardiovascular: There is no cardiomegaly or pericardial effusion. The thoracic aorta is unremarkable. Evaluation of the pulmonary arteries is very limited due to respiratory motion artifact and suboptimal opacification and timing of the contrast. Bilateral lower lobe pulmonary artery emboli involving the lobar and segmental branches noted. There is dilatation of the right ventricle with the right ventricular lumen measuring 4.3 cm and the left ventricular lumen measuring 3.2 cm. The RV/LV ratio is 1.3. Mediastinum/Nodes: No hilar or mediastinal adenopathy. The esophagus and the thyroid gland are grossly unremarkable. No mediastinal fluid collection. Lungs/Pleura: Patchy and streaky density at the left lung base and to a lesser degree involving the right lung base  may represent atelectasis/scarring or areas of infarct. There is a small left pleural effusion. No pneumothorax. The central airways are patent. Upper Abdomen: Probable fatty infiltration of the liver. A subcentimeter right hepatic hypodense focus is too small to characterize. Musculoskeletal: No chest wall abnormality. No acute or significant osseous findings. Review of the MIP images confirms the above findings. IMPRESSION: 1. Bilateral lower lobe pulmonary artery emboli with CT evidence of right heart strain (RV/LV Ratio = 1.3) consistent with at least submassive (intermediate risk) PE. The presence of right heart strain has been associated with an increased risk of morbidity and mortality. 2. Small left pleural effusion.  3. Patchy and streaky density at the left lung base and to a lesser degree involving the right lung base may represent atelectasis/scarring or areas of infarct. These results were called by telephone at the time of interpretation on 04/25/2020 at 12:03 am to provider [facility] , who verbally acknowledged these results. Electronically Signed   By: Anner Crete M.D.   On: 04/25/2020 00:06    EKG: Independently reviewed.  Sinus tachycardia, repolarization abnormality.  Assessment/Plan Principal Problem:   Pulmonary embolism (HCC) Active Problems:   Screening for HIV (human immunodeficiency virus)   Acute bilateral lower lobe PE with CT evidence of right heart strain: Suspect related to hypercoagulable state from recent Covid infection.  No other obvious precipitating factors.  Currently tachypneic but not hypoxic.  No hypotension.  BNP normal.  High-sensitivity troponin checked twice negative. -Admit to progressive care unit and monitor hemodynamics closely.  Continue IV heparin.  Morphine as needed for pleuritic chest pain.  Order echocardiogram.  Hypercoagulable disorder panel pending. Panel for work-up of underlying hypercoagulable disorder pending.  Order bilateral lower extremity Dopplers.  Continuous pulse ox, supplemental oxygen if needed.  HIV screening The patient falls between the ages of 13-64 and should be screened for HIV, therefore HIV testing ordered.  DVT prophylaxis: Heparin Code Status: Full code Family Communication: No family available this time. Disposition Plan: Status is: Inpatient  Remains inpatient appropriate because:Ongoing diagnostic testing needed not appropriate for outpatient work up, IV treatments appropriate due to intensity of illness or inability to take PO and Inpatient level of care appropriate due to severity of illness   Dispo: The patient is from: Home              Anticipated d/c is to: Home              Anticipated d/c date is: 2 days               Patient currently is not medically stable to d/c.  The medical decision making on this patient was of high complexity and the patient is at high risk for clinical deterioration, therefore this is a level 3 visit.  Shela Leff MD [facility]  If 7PM-7AM, please contact night-coverage [email or web address]  04/25/2020, 2:47 AM

## 2020-04-25 NOTE — Progress Notes (Signed)
ANTICOAGULATION CONSULT NOTE - Initial Consult  Pharmacy Consult for heparin Indication: pulmonary embolus  No Known Allergies  Patient Measurements: Height: 6\' 4"  (193 cm) Weight: 104.3 kg (230 lb) IBW/kg (Calculated) : 86.8 Heparin Dosing Weight: 104 kg  Vital Signs: Temp: 98.7 F (37.1 C) (06/04 0105) Temp Source: Oral (06/04 0105) BP: 142/86 (06/04 0105) Pulse Rate: 100 (06/04 0105)  Labs: Recent Labs    04/24/20 2140 04/24/20 2359  HGB 16.5  --   HCT 46.2  --   PLT 235  --   CREATININE 1.13  --   TROPONINIHS <2 <2    Estimated Creatinine Clearance: 114.1 mL/min (by C-G formula based on SCr of 1.13 mg/dL).  Assessment: CC/HPI: 44 yo m presenting with CP and SOB  Anticoag: none pta - iv hep for PE  Renal: SCr 1.13  Pulm: CTA BL LL PE with RV/LV ratio of 1.3 Infarct in LL base  Heme/Onc: H&H 16.5/46.2, Plt 235  Goal of Therapy:  Heparin level 0.3-0.7 units/ml Monitor platelets by anticoagulation protocol: Yes  Plan: Heparin bolus 7000 units x 1 Heparin gtt 1850 units/hr Initial Hep lvl 0730  Daily Hep lvl cbc F/u plans for oral [facility]  [facility], PharmD, BCPS, BCCCP Clinical Pharmacist [phone]  Please check AMION for all [facility] Pharmacy numbers  04/25/2020 1:24 AM

## 2020-04-25 NOTE — Care Management (Signed)
Benefit check sent for DOAcs

## 2020-04-26 DIAGNOSIS — I2699 Other pulmonary embolism without acute cor pulmonale: Secondary | ICD-10-CM | POA: Diagnosis not present

## 2020-04-26 LAB — CBC
HCT: 43.5 % (ref 39.0–52.0)
Hemoglobin: 15.5 g/dL (ref 13.0–17.0)
MCH: 32 pg (ref 26.0–34.0)
MCHC: 35.6 g/dL (ref 30.0–36.0)
MCV: 89.9 fL (ref 80.0–100.0)
Platelets: 226 10*3/uL (ref 150–400)
RBC: 4.84 MIL/uL (ref 4.22–5.81)
RDW: 11.6 % (ref 11.5–15.5)
WBC: 7.3 10*3/uL (ref 4.0–10.5)
nRBC: 0 % (ref 0.0–0.2)

## 2020-04-26 LAB — BETA-2-GLYCOPROTEIN I ABS, IGG/M/A
Beta-2 Glyco I IgG: <9 GPI IgG units (ref 0–20)
Beta-2-Glycoprotein I IgA: 28 GPI IgA units — ABNORMAL HIGH (ref 0–25)
Beta-2-Glycoprotein I IgM: <9 GPI IgM units (ref 0–32)

## 2020-04-26 LAB — CARDIOLIPIN ANTIBODIES, IGG, IGM, IGA
Anticardiolipin IgA: <9 APL U/mL (ref 0–11)
Anticardiolipin IgG: <9 GPL U/mL (ref 0–14)
Anticardiolipin IgM: 11 MPL U/mL (ref 0–12)

## 2020-04-26 LAB — HEPARIN LEVEL (UNFRACTIONATED): Heparin Unfractionated: 0.31 IU/mL (ref 0.30–0.70)

## 2020-04-26 LAB — HOMOCYSTEINE: Homocysteine: 9.8 umol/L (ref 0.0–14.5)

## 2020-04-26 MED ORDER — APIXABAN 5 MG PO TABS: 5.0000 mg | ORAL_TABLET | Freq: Two times a day (BID) | ORAL | 1 refills | Status: DC

## 2020-04-26 MED ORDER — APIXABAN 5 MG PO TABS
10.0000 mg | ORAL_TABLET | Freq: Two times a day (BID) | ORAL | Status: DC
  Administered 2020-04-26: 10 mg via ORAL
  Filled 2020-04-26: qty 2

## 2020-04-26 MED ORDER — TRAMADOL HCL 50 MG PO TABS: 50.0000 mg | ORAL_TABLET | Freq: Four times a day (QID) | ORAL | 0 refills | Status: AC | PRN

## 2020-04-26 MED ORDER — APIXABAN 5 MG PO TABS: 10.0000 mg | ORAL_TABLET | Freq: Two times a day (BID) | ORAL | 0 refills | Status: DC

## 2020-04-26 MED ORDER — GUAIFENESIN-CODEINE 100-10 MG/5ML PO SOLN: 5.0000 mL | ORAL | 0 refills | Status: DC | PRN

## 2020-04-26 MED ORDER — APIXABAN 5 MG PO TABS: 5.0000 mg | ORAL_TABLET | Freq: Two times a day (BID) | ORAL | Status: DC

## 2020-04-26 NOTE — Evaluation (Signed)
Physical Therapy Evaluation and Discharge Patient Details Name: Marcus Marshall MRN: [id number] DOB: [birth year] Today's Date: 04/26/2020   History of Present Illness  44 yo with recent Covid history was admitted, had atelectasis, hemoptysis, SOB and R side pleuritic pai, L lower chest pain.   Noted B PE's with R heart strain.    Slightly tachycardic, improved SOB with treatment.  PMHx:  Covid 19,    Clinical Impression  Pt is up to walk without support or LOB, and O2 sats were controlled from 95 to 99% throughout entire walk and stair bouts.  He is demonstrating no SOB, no pulse elevations, and no elements of systemic strain with effort.  Will anticipate no further needs for PT now, pt is expecting to be discharged from PT and will let nursing know if further issues develop.       Follow Up Recommendations No PT follow up    Equipment Recommendations   None   Recommendations for Other Services  None    Precautions / Restrictions Precautions Precautions: Other (comment)(monitor sats with exertion) Restrictions Weight Bearing Restrictions: No      Mobility  Bed Mobility Overal bed mobility: Independent                Transfers Overall transfer level: Independent                  Ambulation/Gait Ambulation/Gait assistance: Supervision Gait Distance (Feet): 1000 Feet Assistive device: None Gait Pattern/deviations: Step-through pattern Gait velocity: normal Gait velocity interpretation: >4.37 ft/sec, indicative of normal walking speed General Gait Details: monitored sats at 95% lowest value, pulse was 106 at highest observed  Stairs Stairs: Yes Stairs assistance: Supervision Stair Management: No rails;Forwards;Alternating pattern Number of Stairs: 30 General stair comments: monitored sats throughout gait and steps  Wheelchair Mobility    Modified Rankin (Stroke Patients Only)       Balance                                              Pertinent Vitals/Pain Pain Assessment: No/denies pain    Home Living Family/patient expects to be discharged to:: Private residence Living Arrangements: Alone Available Help at Discharge: Family;Available PRN/intermittently Type of Home: House Home Access: Stairs to enter     Home Layout: Multi-level Home Equipment: None Additional Comments: Demonstrating ability to climb stairs    Prior Function Level of Independence: Independent               Hand Dominance   Dominant Hand: Right    Extremity/Trunk Assessment   Upper Extremity Assessment Upper Extremity Assessment: Overall WFL for tasks assessed    Lower Extremity Assessment Lower Extremity Assessment: Overall WFL for tasks assessed    Cervical / Trunk Assessment Cervical / Trunk Assessment: Normal  Communication   Communication: No difficulties  Cognition Arousal/Alertness: Awake/alert Behavior During Therapy: WFL for tasks assessed/performed Overall Cognitive Status: Within Functional Limits for tasks assessed                                        General Comments General comments (skin integrity, edema, etc.): Pt is controlled for pulse, sats, and no LOB with any gait from his room to end of longer hall.  Has ascended two flights of stairs and  used no assistance.      Exercises     Assessment/Plan    PT Assessment Patent does not need any further PT services  PT Problem List         PT Treatment Interventions      PT Goals (Current goals can be found in the Care Plan section)       Frequency     Barriers to discharge        Co-evaluation               AM-PAC PT "6 Clicks" Mobility  Outcome Measure Help needed turning from your back to your side while in a flat bed without using bedrails?: None Help needed moving from lying on your back to sitting on the side of a flat bed without using bedrails?: None Help needed moving to and from a bed to a chair  (including a wheelchair)?: None Help needed standing up from a chair using your arms (e.g., wheelchair or bedside chair)?: None Help needed to walk in hospital room?: None Help needed climbing 3-5 steps with a railing? : None 6 Click Score: 24    End of Session Equipment Utilized During Treatment: Gait belt Activity Tolerance: Patient tolerated treatment well Patient left: with call bell/phone within reach;in chair Nurse Communication: Mobility status;Other (comment)(PT is completed) PT Visit Diagnosis: Difficulty in walking, not elsewhere classified (R26.2)    Time: 9371-6967 PT Time Calculation (min) (ACUTE ONLY): 19 min   Charges:   PT Evaluation $PT Eval Moderate Complexity: 1 Mod         Ivar Drape 04/26/2020, 11:12 AM  Samul Dada, PT MS Acute Rehab Dept. Number: [facility] [id number] and [facility] [phone]

## 2020-04-26 NOTE — Discharge Instructions (Signed)
Information on my medicine - ELIQUIS (apixaban)  This medication education was reviewed with me or my healthcare representative as part of my discharge preparation.   Why was Eliquis prescribed for you? Eliquis was prescribed to treat blood clots that may have been found in the veins of your legs (deep vein thrombosis) or in your lungs (pulmonary embolism) and to reduce the risk of them occurring again.  What do You need to know about Eliquis ? The starting dose is 10 mg (two 5 mg tablets) taken TWICE daily for the FIRST SEVEN (7) DAYS, then on 05/03/2020  the dose is reduced to ONE 5 mg tablet taken TWICE daily.  Eliquis may be taken with or without food.   Try to take the dose about the same time in the morning and in the evening. If you have difficulty swallowing the tablet whole please discuss with your pharmacist how to take the medication safely.  Take Eliquis exactly as prescribed and DO NOT stop taking Eliquis without talking to the doctor who prescribed the medication.  Stopping may increase your risk of developing a new blood clot.  Refill your prescription before you run out.  After discharge, you should have regular check-up appointments with your healthcare provider that is prescribing your Eliquis.    What do you do if you miss a dose? If a dose of ELIQUIS is not taken at the scheduled time, take it as soon as possible on the same day and twice-daily administration should be resumed. The dose should not be doubled to make up for a missed dose.  Important Safety Information A possible side effect of Eliquis is bleeding. You should call your healthcare provider right away if you experience any of the following: ? Bleeding from an injury or your nose that does not stop. ? Unusual colored urine (red or dark brown) or unusual colored stools (red or black). ? Unusual bruising for unknown reasons. ? A serious fall or if you hit your head (even if there is no bleeding).  Some  medicines may interact with Eliquis and might increase your risk of bleeding or clotting while on Eliquis. To help avoid this, consult your healthcare provider or pharmacist prior to using any new prescription or non-prescription medications, including herbals, vitamins, non-steroidal anti-inflammatory drugs (NSAIDs) and supplements.  This website has more information on Eliquis (apixaban): http://[email or web address].com/eliquis/home

## 2020-04-26 NOTE — Discharge Summary (Signed)
Physician Discharge Summary  Marcus Marshall JYN:[id number] DOB: 1976/03/04 DOA: 04/24/2020  PCP: Georgann Housekeeper, MD  Admit date: 04/24/2020 Discharge date: 04/26/2020  Admitted From: Home Disposition:  Home  Discharge Condition:Stable CODE STATUS:FULL Diet recommendation: Regular  Brief/Interim Summary:  Patient is a 44 year old male with history of anxiety, recent Covid infection in March of this year who presented to the emergency part with complaints of chest pain, shortness of breath, hemoptysis.  He reported 4 days history of pleuritic chest pain associated with shortness of breath and cough.  He is usually healthy person and does not take any medications. On presentation he was found to be tachycardic, tachypneic.  Blood pressure was stable.  Not hypoxic.  CT angiogram showed bilateral lower lobe pulmonary artery emboli with CT evidence of right heart strain consistent with at least submassive PE. He was started on heparin drip.  This PE is most likely associated  with recent Covid infection. Hypercoagulable disorder panel also sent and is currently pending. On presentation, He was complaining of left-sided chest pain and cough.  His lungs were clear on auscultation.  He was saturating normally on room air. He was started on IV heparin , we changed to Eliquis. Venous duplex did not show any DVT. Echocardiogram showed normal left ventricle ejection fraction, moderate left ventricular hypertrophy. Patient seen by physical therapy, no follow-up recommended. His chest pain and cough have significantly improved today.  He remains hemodynamically stable for discharge today on Eliquis.  Discharge Diagnoses:  Principal Problem:   Pulmonary embolism (HCC) Active Problems:   Screening for HIV (human immunodeficiency virus)    Discharge Instructions  Discharge Instructions    Call MD for:   Complete by: As directed    Chest pain   Call MD for:  difficulty breathing, headache or visual  disturbances   Complete by: As directed    Call MD for:  persistant dizziness or light-headedness   Complete by: As directed    Diet general   Complete by: As directed    Discharge instructions   Complete by: As directed    1)Please take prescribed medications as instructed. 2)Follow up with your PCP in a week 3)Take rest at home for a week.   Increase activity slowly   Complete by: As directed      Allergies as of 04/26/2020   No Known Allergies     Medication List    TAKE these medications   acetaminophen 500 MG tablet Commonly known as: TYLENOL Take 1,000 mg by mouth every 8 (eight) hours as needed for mild pain.   ALPRAZolam 1 MG tablet Commonly known as: XANAX Take 1 mg by mouth at bedtime as needed for anxiety.   apixaban 5 MG Tabs tablet Commonly known as: ELIQUIS Take 2 tablets (10 mg total) by mouth 2 (two) times daily for 7 days.   apixaban 5 MG Tabs tablet Commonly known as: ELIQUIS Take 1 tablet (5 mg total) by mouth 2 (two) times daily. Start taking on: May 03, 2020   fluticasone 50 MCG/ACT nasal spray Commonly known as: FLONASE Place 2 sprays into both nostrils daily as needed for allergies or rhinitis.   guaiFENesin-codeine 100-10 MG/5ML syrup Take 5 mLs by mouth every 4 (four) hours as needed for cough.   traMADol 50 MG tablet Commonly known as: ULTRAM Take 1 tablet (50 mg total) by mouth every 6 (six) hours as needed for moderate pain.      Follow-up Information    Georgann Housekeeper, MD.  Schedule an appointment as soon as possible for a visit in 1 week(s).   Specialty: Internal Medicine Contact information: [address], [address] [place] [address] [phone]          No Known Allergies  Consultations:  None   Procedures/Studies: DG Chest 2 View  Result Date: 04/24/2020 CLINICAL DATA:  Chest pain EXAM: CHEST - 2 VIEW COMPARISON:  04/22/2020 FINDINGS: Increased density at the left lung base. Minimal increased density  at the right lung base. No pleural effusion or pneumothorax stable cardiomediastinal contours with heart size. IMPRESSION: Increased atelectasis/consolidation at the left much greater than right lung base. Electronically Signed   By: Guadlupe Spanish M.D.   On: 04/24/2020 21:55   DG Chest 2 View  Result Date: 04/22/2020 CLINICAL DATA:  Low left rib chest pain, no injury EXAM: CHEST - 2 VIEW COMPARISON:  08/13/2014 FINDINGS: The heart size and mediastinal contours are within normal limits. Small left pleural effusion. The visualized skeletal structures are unremarkable. IMPRESSION: Small left pleural effusion.  No acute appearing airspace opacity. Electronically Signed   By: Lauralyn Primes M.D.   On: 04/22/2020 14:32   CT Angio Chest PE W/Cm &/Or Wo Cm  Result Date: 04/25/2020 CLINICAL DATA:  44 year old male with concern for pulmonary embolism. EXAM: CT ANGIOGRAPHY CHEST WITH CONTRAST TECHNIQUE: Multidetector CT imaging of the chest was performed using the standard protocol during bolus administration of intravenous contrast. Multiplanar CT image reconstructions and MIPs were obtained to evaluate the vascular anatomy. CONTRAST:  74mL OMNIPAQUE IOHEXOL 350 MG/ML SOLN COMPARISON:  Chest radiograph dated 04/24/2020 FINDINGS: Cardiovascular: There is no cardiomegaly or pericardial effusion. The thoracic aorta is unremarkable. Evaluation of the pulmonary arteries is very limited due to respiratory motion artifact and suboptimal opacification and timing of the contrast. Bilateral lower lobe pulmonary artery emboli involving the lobar and segmental branches noted. There is dilatation of the right ventricle with the right ventricular lumen measuring 4.3 cm and the left ventricular lumen measuring 3.2 cm. The RV/LV ratio is 1.3. Mediastinum/Nodes: No hilar or mediastinal adenopathy. The esophagus and the thyroid gland are grossly unremarkable. No mediastinal fluid collection. Lungs/Pleura: Patchy and streaky density at the  left lung base and to a lesser degree involving the right lung base may represent atelectasis/scarring or areas of infarct. There is a small left pleural effusion. No pneumothorax. The central airways are patent. Upper Abdomen: Probable fatty infiltration of the liver. A subcentimeter right hepatic hypodense focus is too small to characterize. Musculoskeletal: No chest wall abnormality. No acute or significant osseous findings. Review of the MIP images confirms the above findings. IMPRESSION: 1. Bilateral lower lobe pulmonary artery emboli with CT evidence of right heart strain (RV/LV Ratio = 1.3) consistent with at least submassive (intermediate risk) PE. The presence of right heart strain has been associated with an increased risk of morbidity and mortality. 2. Small left pleural effusion. 3. Patchy and streaky density at the left lung base and to a lesser degree involving the right lung base may represent atelectasis/scarring or areas of infarct. These results were called by telephone at the time of interpretation on 04/25/2020 at 12:03 am to provider [facility] , who verbally acknowledged these results. Electronically Signed   By: Elgie Collard M.D.   On: 04/25/2020 00:06   ECHOCARDIOGRAM COMPLETE  Result Date: 04/25/2020    ECHOCARDIOGRAM REPORT   Patient Name:   Marcus Marshall Date of Exam: 04/25/2020 Medical Rec #:  [id number]    Height:  76.0 in Accession #:    [id number]   Weight:       230.0 lb Date of Birth:  1976-03-07   BSA:          2.351 m Patient Age:    43 years     BP:           120/74 mmHg Patient Gender: M            HR:           82 bpm. Exam Location:  Inpatient Procedure: 2D Echo, Cardiac Doppler and Color Doppler Indications:    Pulmonary embolus  History:        Patient has no prior history of Echocardiogram examinations.                 Signs/Symptoms:Shortness of Breath; Risk Factors:Former Smoker.                 H/o recent COVID-19 infection. Orthopnea.  Sonographer:    Ross Ludwig RDCS (AE) Referring Phys: [id number] [facility]  Sonographer Comments: Suboptimal parasternal window. IMPRESSIONS  1. Left ventricular ejection fraction, by estimation, is 60 to 65%. The left ventricle has normal function. The left ventricle has no regional wall motion abnormalities. There is moderate left ventricular hypertrophy. Left ventricular diastolic parameters were normal.  2. Right ventricular systolic function is normal. The right ventricular size is normal. There is normal pulmonary artery systolic pressure.  3. The mitral valve is normal in structure. No evidence of mitral valve regurgitation. No evidence of mitral stenosis.  4. The aortic valve is tricuspid. Aortic valve regurgitation is not visualized. Mild aortic valve sclerosis is present, with no evidence of aortic valve stenosis.  5. The inferior vena cava is dilated in size with >50% respiratory variability, suggesting right atrial pressure of 8 mmHg. FINDINGS  Left Ventricle: Left ventricular ejection fraction, by estimation, is 60 to 65%. The left ventricle has normal function. The left ventricle has no regional wall motion abnormalities. The left ventricular internal cavity size was normal in size. There is  moderate left ventricular hypertrophy. Left ventricular diastolic parameters were normal. Right Ventricle: The right ventricular size is normal. No increase in right ventricular wall thickness. Right ventricular systolic function is normal. There is normal pulmonary artery systolic pressure. The tricuspid regurgitant velocity is 2.12 m/s, and  with an assumed right atrial pressure of 8 mmHg, the estimated right ventricular systolic pressure is 26.0 mmHg. Left Atrium: Left atrial size was normal in size. Right Atrium: Right atrial size was normal in size. Pericardium: There is no evidence of pericardial effusion. Mitral Valve: The mitral valve is normal in structure. Normal mobility of the mitral valve leaflets. No evidence of mitral  valve regurgitation. No evidence of mitral valve stenosis. MV peak gradient, 1.9 mmHg. The mean mitral valve gradient is 1.0 mmHg. Tricuspid Valve: The tricuspid valve is normal in structure. Tricuspid valve regurgitation is trivial. No evidence of tricuspid stenosis. Aortic Valve: The aortic valve is tricuspid. Aortic valve regurgitation is not visualized. Mild aortic valve sclerosis is present, with no evidence of aortic valve stenosis. Aortic valve mean gradient measures 3.0 mmHg. Aortic valve peak gradient measures 4.1 mmHg. Aortic valve area, by VTI measures 2.57 cm. Pulmonic Valve: The pulmonic valve was normal in structure. Pulmonic valve regurgitation is not visualized. No evidence of pulmonic stenosis. Aorta: The aortic root is normal in size and structure. Venous: The inferior vena cava is dilated in size with greater than  50% respiratory variability, suggesting right atrial pressure of 8 mmHg. IAS/Shunts: No atrial level shunt detected by color flow Doppler.  LEFT VENTRICLE PLAX 2D LVIDd:         4.20 cm  Diastology LVIDs:         3.00 cm  LV e' lateral:   7.29 cm/s LV PW:         1.50 cm  LV E/e' lateral: 6.6 LV IVS:        1.50 cm  LV e' medial:    8.27 cm/s LVOT diam:     2.00 cm  LV E/e' medial:  5.8 LV SV:         46 LV SV Index:   19 LVOT Area:     3.14 cm  RIGHT VENTRICLE             IVC RV Basal diam:  3.00 cm     IVC diam: 1.90 cm RV S prime:     17.10 cm/s TAPSE (M-mode): 2.6 cm LEFT ATRIUM             Index       RIGHT ATRIUM           Index LA diam:        2.20 cm 0.94 cm/m  RA Area:     21.20 cm LA Vol (A2C):   35.6 ml 15.14 ml/m RA Volume:   72.30 ml  30.76 ml/m LA Vol (A4C):   42.5 ml 18.08 ml/m LA Biplane Vol: 38.5 ml 16.38 ml/m  AORTIC VALVE AV Area (Vmax):    2.57 cm AV Area (Vmean):   2.42 cm AV Area (VTI):     2.57 cm AV Vmax:           101.00 cm/s AV Vmean:          75.800 cm/s AV VTI:            0.177 m AV Peak Grad:      4.1 mmHg AV Mean Grad:      3.0 mmHg LVOT Vmax:          82.50 cm/s LVOT Vmean:        58.300 cm/s LVOT VTI:          0.145 m LVOT/AV VTI ratio: 0.82  AORTA Ao Root diam: 3.20 cm Ao Asc diam:  3.70 cm MITRAL VALVE               TRICUSPID VALVE MV Area (PHT): 2.82 cm    TR Peak grad:   18.0 mmHg MV Peak grad:  1.9 mmHg    TR Vmax:        212.00 cm/s MV Mean grad:  1.0 mmHg MV Vmax:       0.70 m/s    SHUNTS MV Vmean:      41.7 cm/s   Systemic VTI:  0.14 m MV Decel Time: 269 msec    Systemic Diam: 2.00 cm MV E velocity: 48.00 cm/s MV A velocity: 49.40 cm/s MV E/A ratio:  0.97 Jenkins Rouge MD Electronically signed by Jenkins Rouge MD Signature Date/Time: 04/25/2020/5:47:36 PM    Final    VAS [place] LOWER EXTREMITY VENOUS (DVT)  Result Date: 04/25/2020  Lower Venous DVTStudy Indications: Pulmonary embolism.  Risk Factors: Confirmed PE. Anticoagulation: Heparin. Comparison Study: No prior studies. Performing Technologist: Oliver Hum RVT  Examination Guidelines: A complete evaluation includes B-mode imaging, spectral Doppler, color Doppler, and power Doppler as needed of all accessible  portions of each vessel. Bilateral testing is considered an integral part of a complete examination. Limited examinations for reoccurring indications may be performed as noted. The reflux portion of the exam is performed with the patient in reverse Trendelenburg.  +---------+---------------+---------+-----------+----------+--------------+ RIGHT    CompressibilityPhasicitySpontaneityPropertiesThrombus Aging +---------+---------------+---------+-----------+----------+--------------+ CFV      Full           Yes      Yes                                 +---------+---------------+---------+-----------+----------+--------------+ SFJ      Full                                                        +---------+---------------+---------+-----------+----------+--------------+ FV Prox  Full                                                         +---------+---------------+---------+-----------+----------+--------------+ FV Mid   Full                                                        +---------+---------------+---------+-----------+----------+--------------+ FV DistalFull                                                        +---------+---------------+---------+-----------+----------+--------------+ PFV      Full                                                        +---------+---------------+---------+-----------+----------+--------------+ POP      Full           Yes      Yes                                 +---------+---------------+---------+-----------+----------+--------------+ PTV      Full                                                        +---------+---------------+---------+-----------+----------+--------------+ PERO     Full                                                        +---------+---------------+---------+-----------+----------+--------------+   +---------+---------------+---------+-----------+----------+--------------+ LEFT  CompressibilityPhasicitySpontaneityPropertiesThrombus Aging +---------+---------------+---------+-----------+----------+--------------+ CFV      Full           Yes      Yes                                 +---------+---------------+---------+-----------+----------+--------------+ SFJ      Full                                                        +---------+---------------+---------+-----------+----------+--------------+ FV Prox  Full                                                        +---------+---------------+---------+-----------+----------+--------------+ FV Mid   Full                                                        +---------+---------------+---------+-----------+----------+--------------+ FV DistalFull                                                         +---------+---------------+---------+-----------+----------+--------------+ PFV      Full                                                        +---------+---------------+---------+-----------+----------+--------------+ POP      Full           Yes      Yes                                 +---------+---------------+---------+-----------+----------+--------------+ PTV      Full                                                        +---------+---------------+---------+-----------+----------+--------------+ PERO     Full                                                        +---------+---------------+---------+-----------+----------+--------------+     Summary: RIGHT: - There is no evidence of deep vein thrombosis in the lower extremity.  - No cystic structure found in the popliteal fossa.  LEFT: - There is no evidence of deep vein thrombosis in the lower extremity.  - No cystic structure found in the  popliteal fossa.  *See table(s) above for measurements and observations. Electronically signed by Fabienne Brunsharles Fields MD on 04/25/2020 at 7:41:30 PM.    Final       Subjective: Patient seen and examined at the bedside this morning.  Hemodynamically  stable for discharge.  Discharge Exam: Vitals:   04/26/20 0520 04/26/20 0831  BP: 132/88 126/75  Pulse: 94 94  Resp: 20 18  Temp: 98.8 F (37.1 C) 98.6 F (37 C)  SpO2: 94% 94%   Vitals:   04/25/20 2004 04/26/20 0520 04/26/20 0554 04/26/20 0831  BP: 113/80 132/88  126/75  Pulse: 74 94  94  Resp: 16 20  18   Temp: 98.1 F (36.7 C) 98.8 F (37.1 C)  98.6 F (37 C)  TempSrc: Oral Oral  Oral  SpO2: 94% 94%  94%  Weight:   103.2 kg   Height:        General: Pt is alert, awake, not in acute distress Cardiovascular: RRR, S1/S2 +, no rubs, no gallops Respiratory: CTA bilaterally, no wheezing, no rhonchi Abdominal: Soft, NT, ND, bowel sounds + Extremities: no edema, no cyanosis    The results of significant diagnostics  from this hospitalization (including imaging, microbiology, ancillary and laboratory) are listed below for reference.     Microbiology: Recent Results (from the past 240 hour(s))  SARS Coronavirus 2 by RT PCR (hospital order, [facility]) Nasopharyngeal Nasopharyngeal Swab     Status: None   Collection Time: 04/25/20  1:29 AM   Specimen: Nasopharyngeal Swab  Result Value Ref Range Status   SARS Coronavirus 2 NEGATIVE NEGATIVE Final    Comment: (NOTE) SARS-CoV-2 target nucleic acids are NOT DETECTED. The SARS-CoV-2 RNA is generally detectable in upper and lower respiratory specimens during the acute phase of infection. The lowest concentration of SARS-CoV-2 viral copies this assay can detect is 250 copies / mL. A negative result does not preclude SARS-CoV-2 infection and should not be used as the sole basis for treatment or other patient management decisions.  A negative result may occur with improper specimen collection / handling, submission of specimen other than nasopharyngeal swab, presence of viral mutation(s) within the areas targeted by this assay, and inadequate number of viral copies (<250 copies / mL). A negative result must be combined with clinical observations, patient history, and epidemiological information. Fact Sheet for Patients:   BoilerBrush.com.cyhttps://www.fda.gov/media/136312/download Fact Sheet for Healthcare Providers: https://pope.com/https://www.fda.gov/media/136313/download This test is not yet approved or cleared  by the [place] FDA and has been authorized for detection and/or diagnosis of SARS-CoV-2 by FDA under an Emergency Use Authorization (EUA).  This EUA will remain in effect (meaning this test can be used) for the duration of the COVID-19 declaration under Section 564(b)(1) of the Act, 21 U.S.C. section 360bbb-3(b)(1), unless the authorization is terminated or revoked sooner. Performed at [facility], [address] [place],  [place] [id number]      Labs: BNP (last 3 results) Recent Labs    04/24/20 2203  BNP 31.8   Basic Metabolic Panel: Recent Labs  Lab 04/24/20 2140  NA 137  K 3.9  CL 97*  CO2 24  GLUCOSE 108*  BUN 14  CREATININE 1.13  CALCIUM 9.0   Liver Function Tests: No results for input(s): AST, ALT, ALKPHOS, BILITOT, PROT, ALBUMIN in the last 168 hours. No results for input(s): LIPASE, AMYLASE in the last 168 hours. No results for input(s): AMMONIA in the last 168 hours. CBC: Recent Labs  Lab 04/24/20 2140 04/25/20 0130 04/26/20 0510  WBC 8.9 8.1 7.3  HGB 16.5 16.4 15.5  HCT 46.2 45.5 43.5  MCV 90.1 89.6 89.9  PLT 235 231 226   Cardiac Enzymes: No results for input(s): CKTOTAL, CKMB, CKMBINDEX, TROPONINI in the last 168 hours. BNP: Invalid input(s): POCBNP CBG: No results for input(s): GLUCAP in the last 168 hours. D-Dimer No results for input(s): DDIMER in the last 72 hours. Hgb A1c No results for input(s): HGBA1C in the last 72 hours. Lipid Profile No results for input(s): CHOL, HDL, LDLCALC, TRIG, CHOLHDL, LDLDIRECT in the last 72 hours. Thyroid function studies No results for input(s): TSH, T4TOTAL, T3FREE, THYROIDAB in the last 72 hours.  Invalid input(s): FREET3 Anemia work up No results for input(s): VITAMINB12, FOLATE, FERRITIN, TIBC, IRON, RETICCTPCT in the last 72 hours. Urinalysis No results found for: COLORURINE, APPEARANCEUR, LABSPEC, PHURINE, GLUCOSEU, HGBUR, BILIRUBINUR, KETONESUR, PROTEINUR, UROBILINOGEN, NITRITE, LEUKOCYTESUR Sepsis Labs Invalid input(s): PROCALCITONIN,  WBC,  LACTICIDVEN Microbiology Recent Results (from the past 240 hour(s))  SARS Coronavirus 2 by RT PCR (hospital order, performed in [facility] hospital lab) Nasopharyngeal Nasopharyngeal Swab     Status: None   Collection Time: 04/25/20  1:29 AM   Specimen: Nasopharyngeal Swab  Result Value Ref Range Status   SARS Coronavirus 2 NEGATIVE NEGATIVE Final    Comment: (NOTE) SARS-CoV-2  target nucleic acids are NOT DETECTED. The SARS-CoV-2 RNA is generally detectable in upper and lower respiratory specimens during the acute phase of infection. The lowest concentration of SARS-CoV-2 viral copies this assay can detect is 250 copies / mL. A negative result does not preclude SARS-CoV-2 infection and should not be used as the sole basis for treatment or other patient management decisions.  A negative result may occur with improper specimen collection / handling, submission of specimen other than nasopharyngeal swab, presence of viral mutation(s) within the areas targeted by this assay, and inadequate number of viral copies (<250 copies / mL). A negative result must be combined with clinical observations, patient history, and epidemiological information. Fact Sheet for Patients:   BoilerBrush.com.cy Fact Sheet for Healthcare Providers: https://pope.com/ This test is not yet approved or cleared  by the [place] FDA and has been authorized for detection and/or diagnosis of SARS-CoV-2 by FDA under an Emergency Use Authorization (EUA).  This EUA will remain in effect (meaning this test can be used) for the duration of the COVID-19 declaration under Section 564(b)(1) of the Act, 21 U.S.C. section 360bbb-3(b)(1), unless the authorization is terminated or revoked sooner. Performed at [facility], [address] [place], [place] [address]     Please note: You were cared for by a hospitalist during your hospital stay. Once you are discharged, your primary care physician will handle any further medical issues. Please note that NO REFILLS for any discharge medications will be authorized once you are discharged, as it is imperative that you return to your primary care physician (or establish a relationship with a primary care physician if you do not have one) for your post hospital discharge needs so that they can reassess your  need for medications and monitor your lab values.    Time coordinating discharge: 40 minutes  SIGNED:   Burnadette Pop, MD  [facility] 04/26/2020, 12:38 PM Pager [phone]  If 7PM-7AM, please contact night-coverage [email or web address] Password [email or web address]

## 2020-04-26 NOTE — Progress Notes (Addendum)
ANTICOAGULATION CONSULT NOTE - Initial Consult  Pharmacy Consult for heparin>>Apixaban Indication: pulmonary embolus  No Known Allergies  Patient Measurements: Height: 6\' 4"  (193 cm) Weight: 103.2 kg (227 lb 8.2 oz) IBW/kg (Calculated) : 86.8 Heparin Dosing Weight: 104 kg  Vital Signs: Temp: 98.6 F (37 C) (06/05 0831) Temp Source: Oral (06/05 0831) BP: 126/75 (06/05 0831) Pulse Rate: 94 (06/05 0831)  Labs: Recent Labs    04/24/20 2140 04/24/20 2359 04/25/20 0130 04/25/20 0621 04/25/20 1434  HGB 16.5  --  16.4  --   --   HCT 46.2  --  45.5  --   --   PLT 235  --  231  --   --   HEPARINUNFRC  --   --   --  0.41 0.41  CREATININE 1.13  --   --   --   --   TROPONINIHS <2 <2  --   --   --     Estimated Creatinine Clearance: 105.6 mL/min (by C-G formula based on SCr of 1.13 mg/dL).  Assessment: 44 yo M with acute BL LL PE with RHS likely due to COVID infection in March.  No AC PTA. Pharmacy consulted to transition from Heparin gtt to Apixaban.   Patient has been therapeutic on heparin for 1 day (6/4) with therapeutic HL this AM 0.31. Given low bleeding risk, severity of PE and high clot burden, will give entire 7 day load of Apixaban - 10 mg twice daily for 7 days followed by 5 mg twice daily. CBC and renal function stable.  Informed nurse to give first dose of Apixaban at the time of heparin discontinuation.   Goal of Therapy:  Heparin level 0.3-0.7 units/ml Monitor platelets by anticoagulation protocol: Yes  Plan: Stop Heparin gtt Start Apixaban 10 mg twice daily for 7 days, then 5 mg twice daily starting on 6/12 Will need education Monitor for s/sx of bleeding   8/12, PharmD PGY1 Ambulatory Care Resident [employer] # [phone]  Please check AMION for all [facility] Pharmacy phone numbers After 10:00 PM, call Main Pharmacy [phone]

## 2020-04-26 NOTE — Care Management (Signed)
Provided with Eliquis 30 day free and copay reduction cards

## 2020-04-27 LAB — PROTEIN C, TOTAL: Protein C, Total: 64 % (ref 60–150)

## 2020-04-29 LAB — PROTHROMBIN GENE MUTATION

## 2020-04-29 LAB — LUPUS ANTICOAGULANT PANEL
DRVVT: 42.8 s (ref 0.0–47.0)
PTT Lupus Anticoagulant: 46.2 s (ref 0.0–51.9)

## 2020-04-29 LAB — ANTITHROMBIN PANEL
AT III AG PPP IMM-ACNC: 65 % — ABNORMAL LOW (ref 72–124)
Antithrombin Activity: 85 % (ref 75–135)

## 2020-04-29 LAB — PROTEIN C ACTIVITY: Protein C Activity: 61 % — ABNORMAL LOW (ref 73–180)

## 2020-04-29 LAB — PROTEIN S, TOTAL: Protein S Ag, Total: 90 % (ref 60–150)

## 2020-04-29 LAB — PROTEIN S ACTIVITY: Protein S Activity: 71 % (ref 63–140)

## 2020-05-02 LAB — FACTOR 5 LEIDEN

## 2020-05-21 ENCOUNTER — Other Ambulatory Visit: Payer: Self-pay | Admitting: Internal Medicine

## 2020-05-21 DIAGNOSIS — Z8616 Personal history of COVID-19: Secondary | ICD-10-CM | POA: Diagnosis not present

## 2020-05-21 DIAGNOSIS — I2699 Other pulmonary embolism without acute cor pulmonale: Secondary | ICD-10-CM

## 2020-05-21 DIAGNOSIS — G4700 Insomnia, unspecified: Secondary | ICD-10-CM | POA: Diagnosis not present

## 2020-07-17 DIAGNOSIS — R12 Heartburn: Secondary | ICD-10-CM | POA: Diagnosis not present

## 2020-08-13 ENCOUNTER — Ambulatory Visit
Admission: RE | Admit: 2020-08-13 | Discharge: 2020-08-13 | Disposition: A | Discharge status: Home / Self Care | Payer: BLUE CROSS/BLUE SHIELD | Source: Ambulatory Visit | Attending: Internal Medicine | Admitting: Internal Medicine

## 2020-08-13 ENCOUNTER — Other Ambulatory Visit: Payer: Self-pay

## 2020-08-13 DIAGNOSIS — I2699 Other pulmonary embolism without acute cor pulmonale: Secondary | ICD-10-CM | POA: Diagnosis not present

## 2020-08-13 DIAGNOSIS — Z86711 Personal history of pulmonary embolism: Secondary | ICD-10-CM | POA: Diagnosis not present

## 2020-08-13 MED ORDER — IOPAMIDOL (ISOVUE-370) INJECTION 76%
75.0000 mL | Freq: Once | INTRAVENOUS | Status: AC | PRN
  Administered 2020-08-13: 75 mL via INTRAVENOUS

## 2020-08-29 DIAGNOSIS — Z13220 Encounter for screening for lipoid disorders: Secondary | ICD-10-CM | POA: Diagnosis not present

## 2020-08-29 DIAGNOSIS — J309 Allergic rhinitis, unspecified: Secondary | ICD-10-CM | POA: Diagnosis not present

## 2020-08-29 DIAGNOSIS — Z0000 Encounter for general adult medical examination without abnormal findings: Secondary | ICD-10-CM | POA: Diagnosis not present

## 2020-08-29 DIAGNOSIS — Z125 Encounter for screening for malignant neoplasm of prostate: Secondary | ICD-10-CM | POA: Diagnosis not present

## 2020-08-29 DIAGNOSIS — Z23 Encounter for immunization: Secondary | ICD-10-CM | POA: Diagnosis not present

## 2020-08-29 DIAGNOSIS — Z86711 Personal history of pulmonary embolism: Secondary | ICD-10-CM | POA: Diagnosis not present

## 2020-08-29 DIAGNOSIS — F411 Generalized anxiety disorder: Secondary | ICD-10-CM | POA: Diagnosis not present

## 2020-10-30 ENCOUNTER — Other Ambulatory Visit: Payer: Self-pay | Admitting: Internal Medicine

## 2020-10-30 DIAGNOSIS — R519 Headache, unspecified: Secondary | ICD-10-CM

## 2020-10-30 DIAGNOSIS — H539 Unspecified visual disturbance: Secondary | ICD-10-CM | POA: Diagnosis not present

## 2020-10-30 DIAGNOSIS — E785 Hyperlipidemia, unspecified: Secondary | ICD-10-CM | POA: Diagnosis not present

## 2020-10-30 DIAGNOSIS — F411 Generalized anxiety disorder: Secondary | ICD-10-CM | POA: Diagnosis not present

## 2020-11-25 ENCOUNTER — Ambulatory Visit
Admission: RE | Admit: 2020-11-25 | Discharge: 2020-11-25 | Disposition: A | Discharge status: Home / Self Care | Payer: BLUE CROSS/BLUE SHIELD | Source: Ambulatory Visit | Attending: Internal Medicine | Admitting: Internal Medicine

## 2020-11-25 DIAGNOSIS — H539 Unspecified visual disturbance: Secondary | ICD-10-CM

## 2020-11-25 DIAGNOSIS — R519 Headache, unspecified: Secondary | ICD-10-CM

## 2020-11-25 DIAGNOSIS — J029 Acute pharyngitis, unspecified: Secondary | ICD-10-CM | POA: Diagnosis not present

## 2020-11-25 DIAGNOSIS — R0981 Nasal congestion: Secondary | ICD-10-CM | POA: Diagnosis not present

## 2021-03-02 DIAGNOSIS — F411 Generalized anxiety disorder: Secondary | ICD-10-CM | POA: Diagnosis not present

## 2021-03-02 DIAGNOSIS — E785 Hyperlipidemia, unspecified: Secondary | ICD-10-CM | POA: Diagnosis not present

## 2021-05-21 DIAGNOSIS — F1410 Cocaine abuse, uncomplicated: Secondary | ICD-10-CM | POA: Diagnosis not present

## 2021-06-16 DIAGNOSIS — F1410 Cocaine abuse, uncomplicated: Secondary | ICD-10-CM | POA: Diagnosis not present

## 2021-07-09 DIAGNOSIS — Z308 Encounter for other contraceptive management: Secondary | ICD-10-CM | POA: Diagnosis not present

## 2021-12-18 IMAGING — CT CT ANGIO CHEST
2 of 6 series · 18 of 36 positions shown · IV contrast (omnipaque)
Comparison: Chest radiograph dated 04/24/2020

CLINICAL DATA: 43-year-old male with concern for pulmonary
embolism.

EXAM:
CT ANGIOGRAPHY CHEST WITH CONTRAST
TECHNIQUE: Multidetector CT imaging of the chest was performed using the
standard protocol during bolus administration of intravenous
contrast. Multiplanar CT image reconstructions and MIPs were
obtained to evaluate the vascular anatomy.
CONTRAST:  75mL OMNIPAQUE IOHEXOL 350 MG/ML SOLN

[Series 7: pe thins · axial · 0.94mm/px · z∈[+1083,+1364]mm · 17 of 447 slices shown]
[im 23/447  lung]
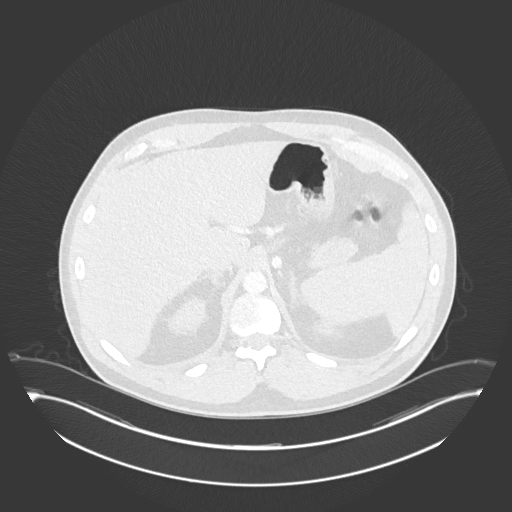
[im 45/447  mediastinal]
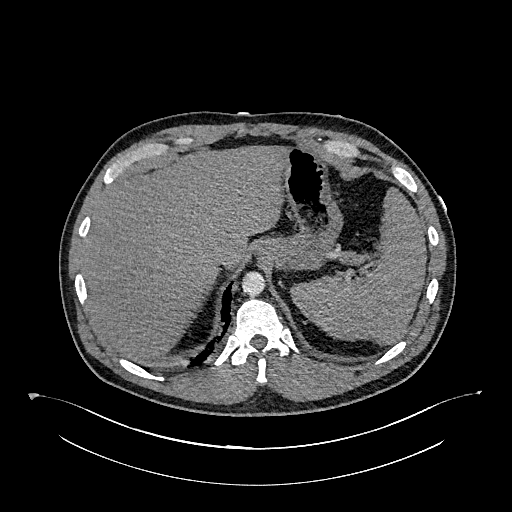
[im 67/447  lung]
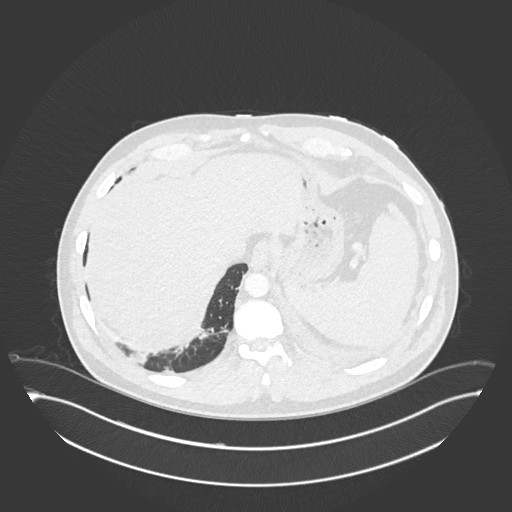
[im 90/447  mediastinal]
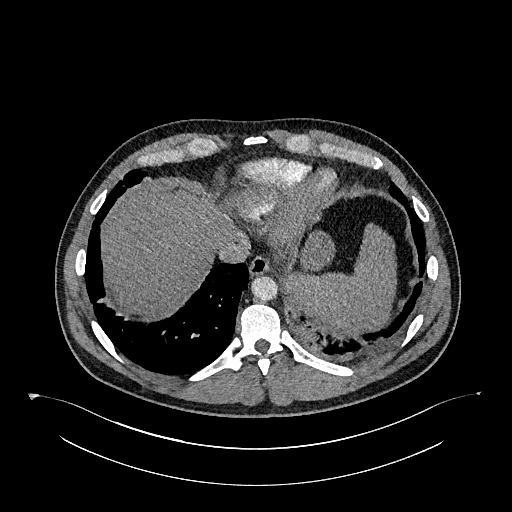
[im 134/447  lung]
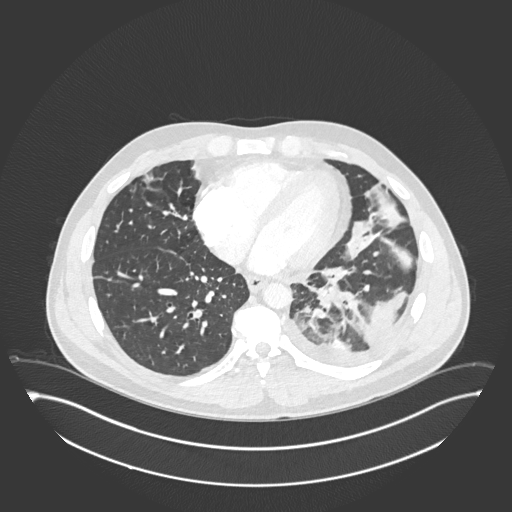
[im 157/447  mediastinal]
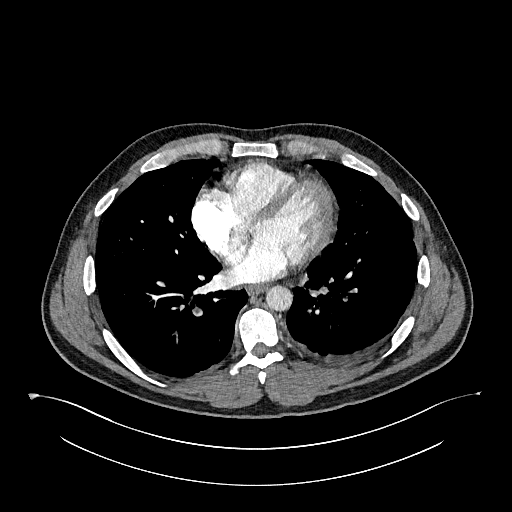
[im 179/447  lung]
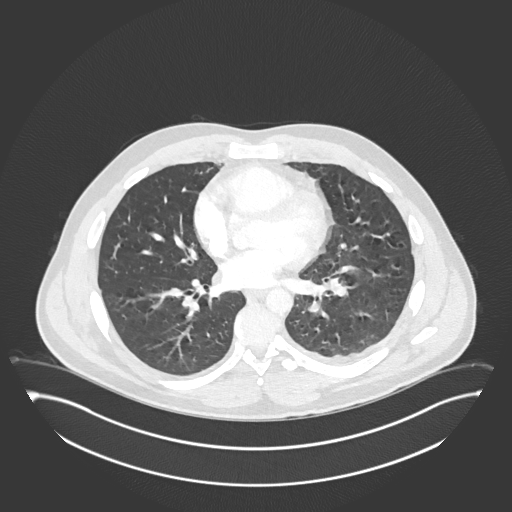
[im 201/447  mediastinal]
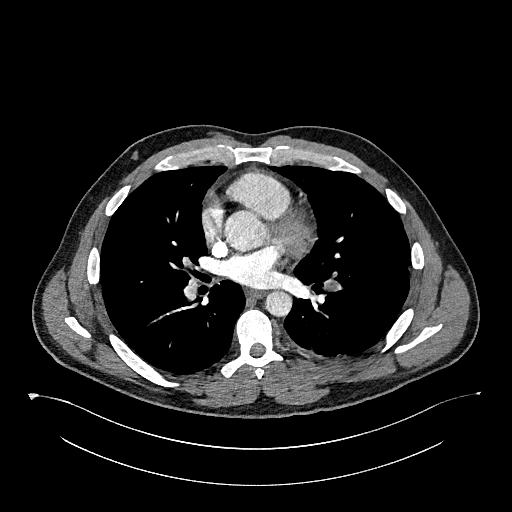
[im 224/447  lung]
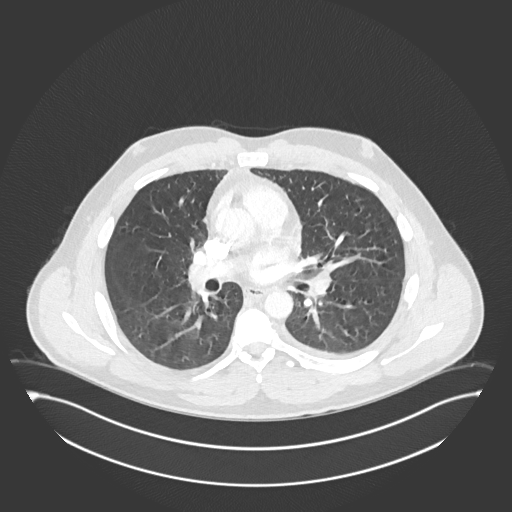
[im 246/447  mediastinal]
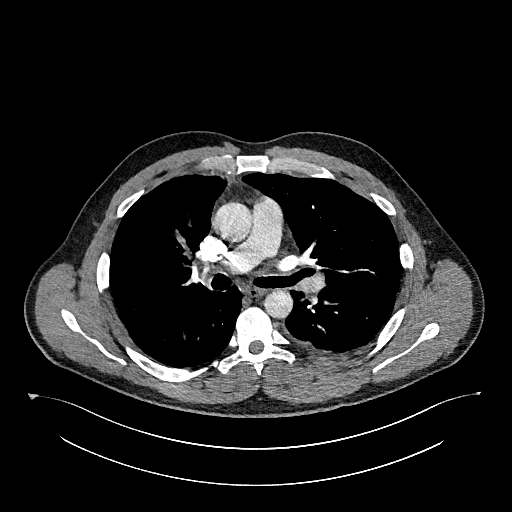
[im 268/447  lung]
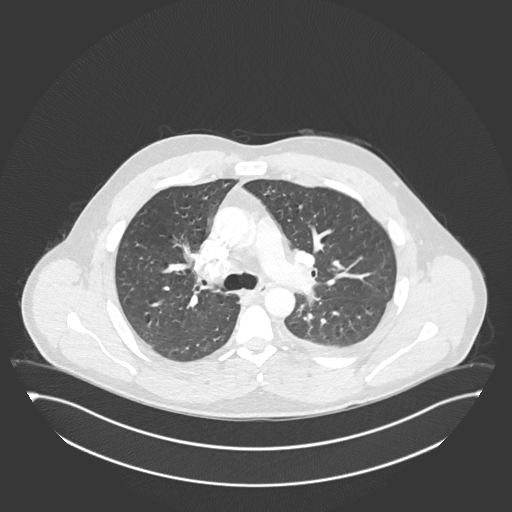
[im 290/447  mediastinal]
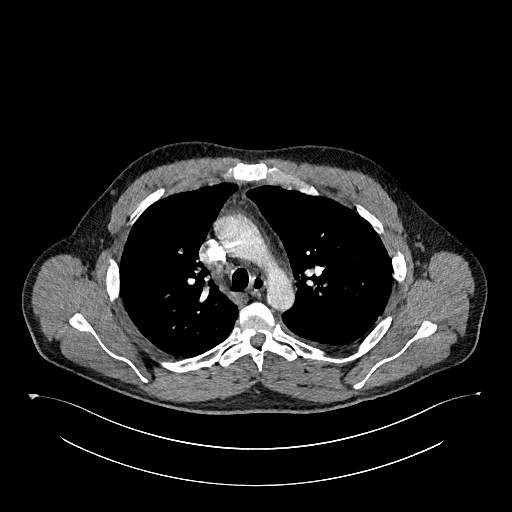
[im 313/447  lung]
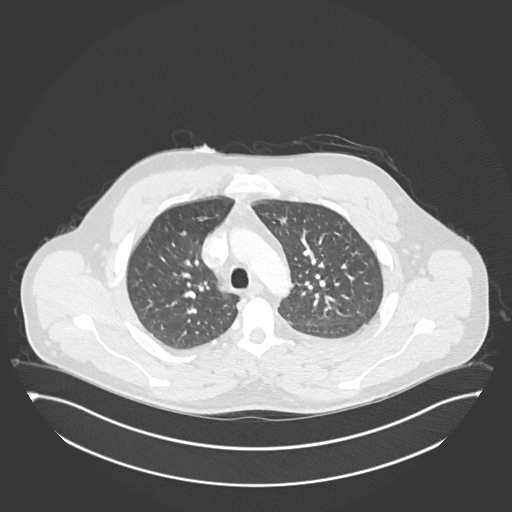
[im 357/447  mediastinal]
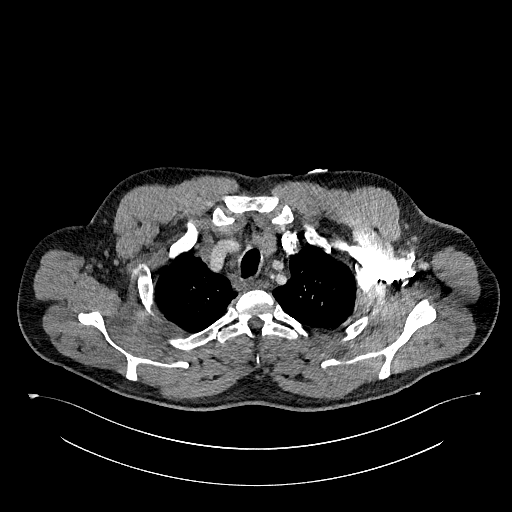
[im 380/447  lung]
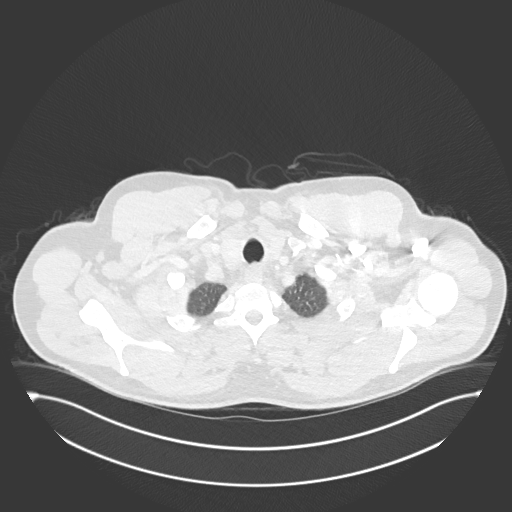
[im 402/447  mediastinal]
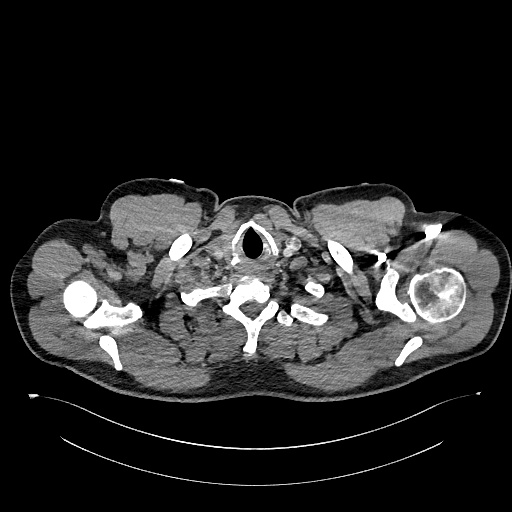
[im 424/447  lung]
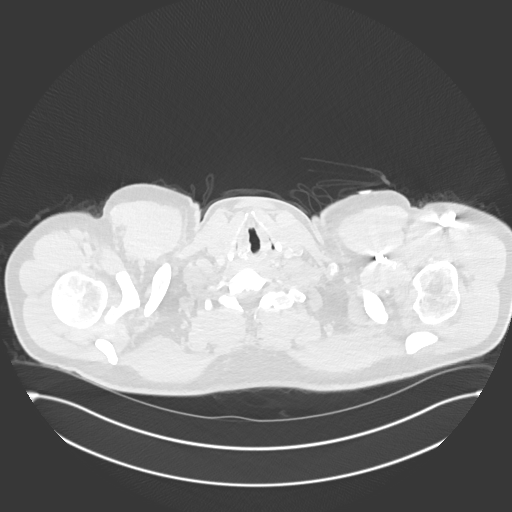

[Series 9: pe 2mm cor · coronal · 0.61mm/px · 1 of 151 slices shown]
[im 76/151  mediastinal]
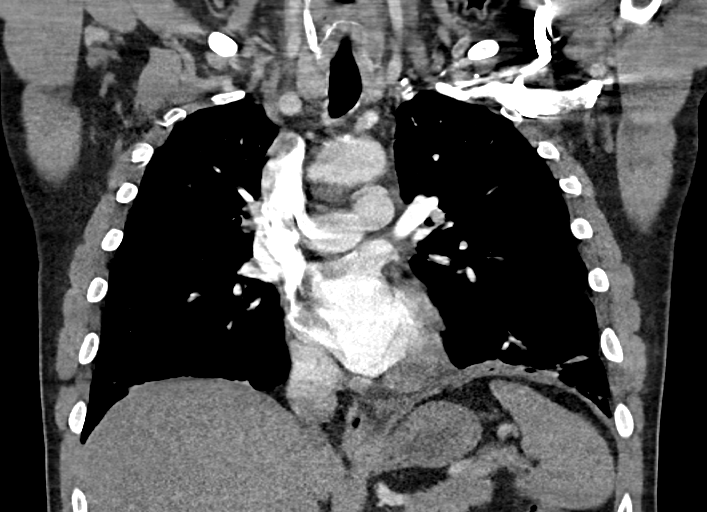

[18 of 36 positions shown; findings below may reference images not displayed]

FINDINGS: Cardiovascular: There is no cardiomegaly or pericardial effusion.
The thoracic aorta is unremarkable. Evaluation of the pulmonary
arteries is very limited due to respiratory motion artifact and
suboptimal opacification and timing of the contrast. Bilateral lower
lobe pulmonary artery emboli involving the lobar and segmental
branches noted. There is dilatation of the right ventricle with the
right ventricular lumen measuring 4.3 cm and the left ventricular
lumen measuring 3.2 cm. The RV/LV ratio is 1.3.

Mediastinum/Nodes: No hilar or mediastinal adenopathy. The esophagus
and the thyroid gland are grossly unremarkable. No mediastinal fluid
collection.

Lungs/Pleura: Patchy and streaky density at the left lung base and
to a lesser degree involving the right lung base may represent
atelectasis/scarring or areas of infarct. There is a small left
pleural effusion. No pneumothorax. The central airways are patent.

Upper Abdomen: Probable fatty infiltration of the liver. A
subcentimeter right hepatic hypodense focus is too small to
characterize.

Musculoskeletal: No chest wall abnormality. No acute or significant
osseous findings.

Review of the MIP images confirms the above findings.
IMPRESSION: 1. Bilateral lower lobe pulmonary artery emboli with CT evidence of
right heart strain (RV/LV Ratio = 1.3) consistent with at least
submassive (intermediate risk) PE. The presence of right heart
strain has been associated with an increased risk of morbidity and
mortality.
2. Small left pleural effusion.
3. Patchy and streaky density at the left lung base and to a lesser
degree involving the right lung base may represent
atelectasis/scarring or areas of infarct.

These results were called by telephone at the time of interpretation
on 04/25/2020 at [DATE] to provider AH CHONG MEGAT , who verbally
acknowledged these results.

## 2022-01-30 DIAGNOSIS — R030 Elevated blood-pressure reading, without diagnosis of hypertension: Secondary | ICD-10-CM | POA: Diagnosis not present

## 2022-01-30 DIAGNOSIS — Z20822 Contact with and (suspected) exposure to covid-19: Secondary | ICD-10-CM | POA: Diagnosis not present

## 2022-01-30 DIAGNOSIS — J029 Acute pharyngitis, unspecified: Secondary | ICD-10-CM | POA: Diagnosis not present

## 2022-01-30 DIAGNOSIS — Z03818 Encounter for observation for suspected exposure to other biological agents ruled out: Secondary | ICD-10-CM | POA: Diagnosis not present

## 2022-02-03 DIAGNOSIS — J40 Bronchitis, not specified as acute or chronic: Secondary | ICD-10-CM | POA: Diagnosis not present

## 2023-04-25 ENCOUNTER — Emergency Department (HOSPITAL_BASED_OUTPATIENT_CLINIC_OR_DEPARTMENT_OTHER): Payer: BC Managed Care – PPO

## 2023-04-25 ENCOUNTER — Other Ambulatory Visit: Payer: Self-pay

## 2023-04-25 ENCOUNTER — Emergency Department (HOSPITAL_BASED_OUTPATIENT_CLINIC_OR_DEPARTMENT_OTHER)
Admission: EM | Admit: 2023-04-25 | Discharge: 2023-04-25 | Disposition: A | Discharge status: Home / Self Care | Payer: BC Managed Care – PPO | Attending: Emergency Medicine | Admitting: Emergency Medicine

## 2023-04-25 ENCOUNTER — Encounter (HOSPITAL_BASED_OUTPATIENT_CLINIC_OR_DEPARTMENT_OTHER): Payer: Self-pay | Admitting: Radiology

## 2023-04-25 DIAGNOSIS — R0602 Shortness of breath: Secondary | ICD-10-CM | POA: Diagnosis not present

## 2023-04-25 DIAGNOSIS — R0789 Other chest pain: Secondary | ICD-10-CM

## 2023-04-25 DIAGNOSIS — R079 Chest pain, unspecified: Secondary | ICD-10-CM | POA: Diagnosis not present

## 2023-04-25 LAB — BASIC METABOLIC PANEL
Anion gap: 8 (ref 5–15)
BUN: 11 mg/dL (ref 6–20)
CO2: 27 mmol/L (ref 22–32)
Calcium: 9 mg/dL (ref 8.9–10.3)
Chloride: 103 mmol/L (ref 98–111)
Creatinine, Ser: 1.07 mg/dL (ref 0.61–1.24)
GFR, Estimated: >60 mL/min (ref >60)
Glucose, Bld: 78 mg/dL (ref 70–99)
Potassium: 3.9 mmol/L (ref 3.5–5.1)
Sodium: 138 mmol/L (ref 135–145)

## 2023-04-25 LAB — CBC
HCT: 46.6 % (ref 39.0–52.0)
Hemoglobin: 16.8 g/dL (ref 13.0–17.0)
MCH: 31.9 pg (ref 26.0–34.0)
MCHC: 36.1 g/dL — ABNORMAL HIGH (ref 30.0–36.0)
MCV: 88.4 fL (ref 80.0–100.0)
Platelets: 207 10*3/uL (ref 150–400)
RBC: 5.27 MIL/uL (ref 4.22–5.81)
RDW: 12.1 % (ref 11.5–15.5)
WBC: 5.7 10*3/uL (ref 4.0–10.5)
nRBC: 0 % (ref 0.0–0.2)

## 2023-04-25 LAB — TROPONIN I (HIGH SENSITIVITY): Troponin I (High Sensitivity): 3 ng/L (ref <18)

## 2023-04-25 MED ORDER — IPRATROPIUM-ALBUTEROL 0.5-2.5 (3) MG/3ML IN SOLN
3.0000 mL | Freq: Once | RESPIRATORY_TRACT | Status: AC
  Administered 2023-04-25: 3 mL via RESPIRATORY_TRACT
  Filled 2023-04-25: qty 3

## 2023-04-25 MED ORDER — METHOCARBAMOL 500 MG PO TABS: 500.0000 mg | ORAL_TABLET | Freq: Two times a day (BID) | ORAL | 0 refills | Status: AC | PRN

## 2023-04-25 MED ORDER — CELECOXIB 200 MG PO CAPS: 200.0000 mg | ORAL_CAPSULE | Freq: Two times a day (BID) | ORAL | 0 refills | Status: AC | PRN

## 2023-04-25 MED ORDER — IOHEXOL 350 MG/ML SOLN
100.0000 mL | Freq: Once | INTRAVENOUS | Status: AC | PRN
  Administered 2023-04-25: 100 mL via INTRAVENOUS

## 2023-04-25 MED ORDER — KETOROLAC TROMETHAMINE 30 MG/ML IJ SOLN
30.0000 mg | Freq: Once | INTRAMUSCULAR | Status: AC
  Administered 2023-04-25: 30 mg via INTRAVENOUS
  Filled 2023-04-25: qty 1

## 2023-04-25 MED ORDER — AEROCHAMBER Z-STAT PLUS/MEDIUM MISC
1.0000 | Freq: Once | Status: AC
  Administered 2023-04-25: 1
  Filled 2023-04-25: qty 1

## 2023-04-25 MED ORDER — ALBUTEROL SULFATE HFA 108 (90 BASE) MCG/ACT IN AERS
1.0000 | INHALATION_SPRAY | RESPIRATORY_TRACT | Status: DC | PRN
  Administered 2023-04-25: 1 via RESPIRATORY_TRACT
  Filled 2023-04-25: qty 6.7

## 2023-04-25 NOTE — Discharge Instructions (Addendum)
Try to stop vaping. 

## 2023-04-25 NOTE — ED Provider Notes (Signed)
[place] [facility] Provider Note   CSN: [id number] Arrival date & time: 04/25/23  8413     History  Chief Complaint  Patient presents with   Chest Pain    Marcus Marshall is a 47 y.o. male.  Pt is a 47 yo male with pmhx significant for anxiety, PE (s/p Covid infection), no longer on eliquis.  Pt said he sneezed about 2 weeks ago and felt a pop in his back.  He has had pain in his upper back since then.  He has some sob as well.  He is concerned that he has a PE.         Home Medications Prior to Admission medications   Medication Sig Start Date End Date Taking? Authorizing Provider  celecoxib (CELEBREX) 200 MG capsule Take 1 capsule (200 mg total) by mouth 2 (two) times daily as needed for mild pain. 04/25/23  Yes Jacalyn Lefevre, MD  methocarbamol (ROBAXIN) 500 MG tablet Take 1 tablet (500 mg total) by mouth 2 (two) times daily as needed for muscle spasms. 04/25/23  Yes Jacalyn Lefevre, MD  sildenafil (VIAGRA) 50 MG tablet Take 50 mg by mouth daily as needed. 03/17/23  Yes [provider]  ALPRAZolam Prudy Feeler) 1 MG tablet Take 1 mg by mouth at bedtime as needed for anxiety. 04/17/20   [provider]  traMADol (ULTRAM) 50 MG tablet Take 1 tablet (50 mg total) by mouth every 6 (six) hours as needed for moderate pain. 04/26/20   Burnadette Pop, MD      Allergies    Patient has no known allergies.    Review of Systems   Review of Systems  Respiratory:  Positive for shortness of breath.   Musculoskeletal:  Positive for back pain.  All other systems reviewed and are negative.   Physical Exam Updated Vital Signs BP (!) 132/95   Pulse 98   Temp 98.9 F (37.2 C) (Oral)   Resp 14   Ht 6\' 2"  (1.88 m)   Wt 99.8 kg   SpO2 100%   BMI 28.25 kg/m  Physical Exam Vitals and nursing note reviewed.  Constitutional:      Appearance: He is well-developed.  HENT:     Head: Normocephalic and atraumatic.  Eyes:     Extraocular  Movements: Extraocular movements intact.     Pupils: Pupils are equal, round, and reactive to light.  Cardiovascular:     Rate and Rhythm: Regular rhythm. Tachycardia present.     Heart sounds: Normal heart sounds.  Pulmonary:     Effort: Pulmonary effort is normal.     Breath sounds: Normal breath sounds.  Abdominal:     General: Bowel sounds are normal.     Palpations: Abdomen is soft.  Musculoskeletal:        General: Normal range of motion.     Cervical back: Normal range of motion and neck supple.  Skin:    Capillary Refill: Capillary refill takes less than 2 seconds.  Neurological:     General: No focal deficit present.     Mental Status: He is alert and oriented to person, place, and time.  Psychiatric:        Mood and Affect: Mood normal.        Behavior: Behavior normal.     ED Results / Procedures / Treatments   Labs (all labs ordered are listed, but only abnormal results are displayed) Labs Reviewed  CBC - Abnormal; Notable for  the following components:      Result Value   MCHC 36.1 (*)    All other components within normal limits  BASIC METABOLIC PANEL  TROPONIN I (HIGH SENSITIVITY)  TROPONIN I (HIGH SENSITIVITY)    EKG EKG Interpretation  Date/Time:  Monday April 25 2023 09:44:39 EDT Ventricular Rate:  106 PR Interval:  140 QRS Duration: 96 QT Interval:  340 QTC Calculation: 452 R Axis:   82 Text Interpretation: Sinus tachycardia ST elev, probable normal early repol pattern No significant change since last tracing Confirmed by Jacalyn Lefevre [phone]) on 04/25/2023 10:01:31 AM  Radiology CT Angio Chest PE W and/or Wo Contrast  Result Date: 04/25/2023 CLINICAL DATA:  Chest pain for the past 2 weeks. The patient sneezed and felt a pop in his middle upper back and since then the pain has spread to his left shoulder, left arm and left chest. Shortness of breath. History of pulmonary embolism. EXAM: CT ANGIOGRAPHY CHEST WITH CONTRAST TECHNIQUE: Multidetector CT  imaging of the chest was performed using the standard protocol during bolus administration of intravenous contrast. Multiplanar CT image reconstructions and MIPs were obtained to evaluate the vascular anatomy. RADIATION DOSE REDUCTION: This exam was performed according to the departmental dose-optimization program which includes automated exposure control, adjustment of the mA and/or kV according to patient size and/or use of iterative reconstruction technique. CONTRAST:  OMNIPAQUE IOHEXOL 350 MG/ML SOLN COMPARISON:  08/13/2020 FINDINGS: Cardiovascular: Satisfactory opacification of the pulmonary arteries to the segmental level. No evidence of pulmonary embolism. Normal heart size. No pericardial effusion. Normal appearing thoracic aorta Mediastinum/Nodes: No enlarged mediastinal, hilar, or axillary lymph nodes. Thyroid gland, trachea, and esophagus demonstrate no significant findings. Lungs/Pleura: Stable small triangular shaped area of scarring at the superior aspect of the superior segment of the right lower lobe. Otherwise, the lungs are clear. No pleural effusion or pneumothorax. Upper Abdomen: Stable small right lobe liver cyst. Musculoskeletal: Mild thoracic spine degenerative changes and mild scoliosis. Review of the MIP images confirms the above findings. IMPRESSION: No pulmonary emboli or acute abnormality. Electronically Signed   By: Beckie Salts M.D.   On: 04/25/2023 11:10    Procedures Procedures    Medications Ordered in ED Medications  albuterol (VENTOLIN HFA) 108 (90 Base) MCG/ACT inhaler 1-2 puff (has no administration in time range)  aerochamber Z-Stat Plus/medium 1 each (has no administration in time range)  ketorolac (TORADOL) 30 MG/ML injection 30 mg (30 mg Intravenous Given 04/25/23 1004)  ipratropium-albuterol (DUONEB) 0.5-2.5 (3) MG/3ML nebulizer solution 3 mL (3 mLs Nebulization Given 04/25/23 0955)  iohexol (OMNIPAQUE) 350 MG/ML injection 100 mL (100 mLs Intravenous Contrast  Given 04/25/23 1045)    ED Course/ Medical Decision Making/ A&P                             Medical Decision Making Amount and/or Complexity of Data Reviewed Labs: ordered. Radiology: ordered.  Risk Prescription drug management.   This patient presents to the ED for concern of cp/sob, this involves an extensive number of treatment options, and is a complaint that carries with it a high risk of complications and morbidity.  The differential diagnosis includes msk, PE, pna, cardiac   Co morbidities that complicate the patient evaluation  anxiety, PE (s/p Covid infection)   Additional history obtained:  Additional history obtained from epic chart review  Lab Tests:  I Ordered, and personally interpreted labs.  The pertinent results include:  cbc  nl, bmp nl, trop nl   Imaging Studies ordered:  I ordered imaging studies including ct a chest  I independently visualized and interpreted imaging which showed No pulmonary emboli or acute abnormality.  I agree with the radiologist interpretation   Cardiac Monitoring:  The patient was maintained on a cardiac monitor.  I personally viewed and interpreted the cardiac monitored which showed an underlying rhythm of: nsr   Medicines ordered and prescription drug management:  I ordered medication including toradol/duoneb  for sx  Reevaluation of the patient after these medicines showed that the patient improved I have reviewed the patients home medicines and have made adjustments as needed   Test Considered:  ct   Critical Interventions:  ct   Problem List / ED Course:  CP:  likely msk.  Ct chest neg for PE.  EKG and trop nl.  Pt is stable for d/c.  Return if worse.  F/u with pcp.   Reevaluation:  After the interventions noted above, I reevaluated the patient and found that they have :improved   Social Determinants of Health:  Lives at home   Dispostion:  After consideration of the diagnostic results and the  patients response to treatment, I feel that the patent would benefit from discharge with outpatient f/u.          Final Clinical Impression(s) / ED Diagnoses Final diagnoses:  Atypical chest pain    Rx / DC Orders ED Discharge Orders          Ordered    methocarbamol (ROBAXIN) 500 MG tablet  2 times daily PRN        04/25/23 1121    celecoxib (CELEBREX) 200 MG capsule  2 times daily PRN        04/25/23 1121              Jacalyn Lefevre, MD 04/25/23 1123

## 2023-04-25 NOTE — ED Notes (Signed)
Patient given MDI to take home and instructed how to use it. No questions

## 2023-04-25 NOTE — ED Triage Notes (Signed)
Patient presents to ED via POV from home. Here with chest pain x 2 weeks. Patient sneezed and felt a "pop" in his middle upper back. Since then his pain has spread to his left shoulder, left arm and left chest. Patient reports shortness of breath. History of PE. Well appearing.

## 2023-07-10 ENCOUNTER — Emergency Department (HOSPITAL_BASED_OUTPATIENT_CLINIC_OR_DEPARTMENT_OTHER)
Admission: EM | Admit: 2023-07-10 | Discharge: 2023-07-10 | Disposition: A | Discharge status: Home / Self Care | Payer: BC Managed Care – PPO | Source: Home / Self Care | Attending: Emergency Medicine | Admitting: Emergency Medicine

## 2023-07-10 ENCOUNTER — Encounter (HOSPITAL_BASED_OUTPATIENT_CLINIC_OR_DEPARTMENT_OTHER): Payer: Self-pay | Admitting: Emergency Medicine

## 2023-07-10 ENCOUNTER — Other Ambulatory Visit: Payer: Self-pay

## 2023-07-10 ENCOUNTER — Emergency Department (HOSPITAL_BASED_OUTPATIENT_CLINIC_OR_DEPARTMENT_OTHER)
Admission: EM | Admit: 2023-07-10 | Discharge: 2023-07-10 | Discharge status: Against Medical Advice | Payer: BC Managed Care – PPO | Source: Home / Self Care | Attending: Emergency Medicine | Admitting: Emergency Medicine

## 2023-07-10 DIAGNOSIS — Z5321 Procedure and treatment not carried out due to patient leaving prior to being seen by health care provider: Secondary | ICD-10-CM

## 2023-07-10 DIAGNOSIS — F22 Delusional disorders: Secondary | ICD-10-CM

## 2023-07-10 DIAGNOSIS — S0086XA Insect bite (nonvenomous) of other part of head, initial encounter: Secondary | ICD-10-CM | POA: Diagnosis not present

## 2023-07-10 DIAGNOSIS — S0096XA Insect bite (nonvenomous) of unspecified part of head, initial encounter: Secondary | ICD-10-CM | POA: Diagnosis not present

## 2023-07-10 DIAGNOSIS — W57XXXA Bitten or stung by nonvenomous insect and other nonvenomous arthropods, initial encounter: Secondary | ICD-10-CM | POA: Insufficient documentation

## 2023-07-10 MED ORDER — PERMETHRIN 5 % EX CREA: TOPICAL_CREAM | CUTANEOUS | 0 refills | Status: AC

## 2023-07-10 MED ORDER — DOXYCYCLINE HYCLATE 100 MG PO CAPS: 100.0000 mg | ORAL_CAPSULE | Freq: Two times a day (BID) | ORAL | 0 refills | Status: AC

## 2023-07-10 MED ORDER — HYDROXYZINE HCL 25 MG PO TABS: 25.0000 mg | ORAL_TABLET | Freq: Four times a day (QID) | ORAL | 0 refills | Status: AC

## 2023-07-10 NOTE — ED Triage Notes (Signed)
Pt here for wound recheck.  Pt states he saw a worm in his upper thigh.  Pt believes he has insects in his leg.

## 2023-07-10 NOTE — ED Triage Notes (Signed)
C/O insect bite and severe itching the last coupleof days; states its burrowing on his left anterior leg and groin area.

## 2023-07-10 NOTE — ED Provider Notes (Addendum)
[place] [facility] Provider Note   CSN: [id number] Arrival date & time: 07/10/23  0709     History  Chief Complaint  Patient presents with   Insect Bite    Marcus Marshall is a 47 y.o. male.  Patient here with insect issues.  He thinks that he has chiggers from a pond that he was fishing from.  Been there the last 24 hours or so.  Multiple areas of skin irritation.  1 area in his left groin with a little bit more red.  He can see maybe some mites and eggs and feels it burrowing.  Denies any fever or chills.  No significant medical problems otherwise.  Nothing makes it worse or better.  He has been using some benzocaine ointment that has helped a little bit.  The history is provided by the patient.       Home Medications Prior to Admission medications   Medication Sig Start Date End Date Taking? Authorizing Provider  doxycycline (VIBRAMYCIN) 100 MG capsule Take 1 capsule (100 mg total) by mouth 2 (two) times daily. 07/10/23  Yes Paiden Caraveo, DO  hydrOXYzine (ATARAX) 25 MG tablet Take 1 tablet (25 mg total) by mouth every 6 (six) hours for 25 doses. 07/10/23 07/17/23 Yes Srinivas Lippman, DO  ALPRAZolam Prudy Feeler) 1 MG tablet Take 1 mg by mouth at bedtime as needed for anxiety. 04/17/20   [provider]  celecoxib (CELEBREX) 200 MG capsule Take 1 capsule (200 mg total) by mouth 2 (two) times daily as needed for mild pain. 04/25/23   Jacalyn Lefevre, MD  methocarbamol (ROBAXIN) 500 MG tablet Take 1 tablet (500 mg total) by mouth 2 (two) times daily as needed for muscle spasms. 04/25/23   Jacalyn Lefevre, MD  sildenafil (VIAGRA) 50 MG tablet Take 50 mg by mouth daily as needed. 03/17/23   [provider]  traMADol (ULTRAM) 50 MG tablet Take 1 tablet (50 mg total) by mouth every 6 (six) hours as needed for moderate pain. 04/26/20   Burnadette Pop, MD      Allergies    Patient has no known allergies.    Review of Systems   Review of  Systems  Physical Exam Updated Vital Signs BP (!) 143/121   Pulse 86   Temp 97.8 F (36.6 C) (Oral)   Resp 17   Ht 6\' 3"  (1.905 m)   Wt 97.5 kg   SpO2 95%   BMI 26.87 kg/m  Physical Exam Vitals and nursing note reviewed.  Constitutional:      General: He is not in acute distress.    Appearance: He is well-developed.  HENT:     Head: Normocephalic and atraumatic.  Eyes:     Conjunctiva/sclera: Conjunctivae normal.  Cardiovascular:     Rate and Rhythm: Normal rate and regular rhythm.     Heart sounds: No murmur heard. Pulmonary:     Effort: Pulmonary effort is normal. No respiratory distress.     Breath sounds: Normal breath sounds.  Abdominal:     Palpations: Abdomen is soft.     Tenderness: There is no abdominal tenderness.  Musculoskeletal:        General: No swelling.     Cervical back: Neck supple.  Skin:    General: Skin is warm and dry.     Capillary Refill: Capillary refill takes less than 2 seconds.     Comments: Couple areas of small areas of skin breakdown, a little bit of  an area of skin breakdown in the left groin with little bit of redness but no fluctuance or drainage  Neurological:     Mental Status: He is alert.  Psychiatric:        Mood and Affect: Mood normal.     ED Results / Procedures / Treatments   Labs (all labs ordered are listed, but only abnormal results are displayed) Labs Reviewed - No data to display  EKG None  Radiology No results found.  Procedures Procedures    Medications Ordered in ED Medications - No data to display  ED Course/ Medical Decision Making/ A&P                                 Medical Decision Making Risk Prescription drug management.   Jabali Everage is here with insect bite.  Normal vitals.  No fever.  He thinks that he likely has chiggers from recent fishing trip.  Does not appear to be consistent with scabies or other mite on exam at this time.  Does not have lesions really in between his webs of  his feet and hands.  He has been using benzocaine ointment.  Overall maybe 1 area of a little bit of cellulitis and will treat with doxycycline.  Will give him Vistaril to help with anxiety and itching as well.  Recommend Benadryl as needed as well and recommend hydrocortisone ointment as well.  Overall I think this will self resolve.  He is given reassurance.  I do recommend follow-up with his primary care doctor for wound rechecks.  He understands return precautions.  Patient called back to the ED and did want to do permethrin cream which I think is reasonable.  This was sent to his pharmacy.  This chart was dictated using voice recognition software.  Despite best efforts to proofread,  errors can occur which can change the documentation meaning.         Final Clinical Impression(s) / ED Diagnoses Final diagnoses:  Insect bite of head, unspecified part, initial encounter    Rx / DC Orders ED Discharge Orders          Ordered    hydrOXYzine (ATARAX) 25 MG tablet  Every 6 hours        07/10/23 0729    doxycycline (VIBRAMYCIN) 100 MG capsule  2 times daily        07/10/23 0729              Virgina Norfolk, DO 07/10/23 0732    Lockie Mola, Laiken Sandy, DO 07/10/23 2725    Virgina Norfolk, DO 07/11/23 3664

## 2023-07-10 NOTE — Discharge Instructions (Addendum)
I have prescribed you antibiotic to help as well as antiitch/antianxiety medicine called Droxia seen to help.  You can continue to use benzocaine ointment that you have or you can also consider using hydrocortisone cream over-the-counter as well.  Follow-up with your primary care doctor later this week.  Please return if you develop any signs of abscess or other concerning symptoms.  You are doing all the right things to take care of this.  This should self resolve.

## 2023-07-10 NOTE — ED Provider Notes (Addendum)
[place] [facility] Provider Note  CSN: [id number] Arrival date & time: 07/10/23 1401  Chief Complaint(s) Wound Check  HPI Marcus Marshall is a 47 y.o. male with history of anxiety presenting to the emergency department with concern for infestation.  Patient reports that he was here earlier today, was evaluated for insect bite.  He is concerned that there is an insect or some other organism burrowed in his leg.  He was here earlier, after discharge he reports he attempted to cut his leg to expose the parasite but was unable to have it removed and he wants me to remove it.  He also presents an index card with some skin flakes or other debris on it and reports that this is the "claw" of the organism.   Past Medical History Past Medical History:  Diagnosis Date   Anxiety    Hemorrhoids    Patient Active Problem List   Diagnosis Date Noted   Pulmonary embolism (HCC) 04/25/2020   Screening for HIV (human immunodeficiency virus) 04/25/2020   Pruritus ani 09/08/2012   Home Medication(s) Prior to Admission medications   Medication Sig Start Date End Date Taking? Authorizing Provider  ALPRAZolam Prudy Feeler) 1 MG tablet Take 1 mg by mouth at bedtime as needed for anxiety. 04/17/20   [provider]  celecoxib (CELEBREX) 200 MG capsule Take 1 capsule (200 mg total) by mouth 2 (two) times daily as needed for mild pain. 04/25/23   Jacalyn Lefevre, MD  doxycycline (VIBRAMYCIN) 100 MG capsule Take 1 capsule (100 mg total) by mouth 2 (two) times daily. 07/10/23   Curatolo, Adam, DO  hydrOXYzine (ATARAX) 25 MG tablet Take 1 tablet (25 mg total) by mouth every 6 (six) hours for 25 doses. 07/10/23 07/17/23  Curatolo, Adam, DO  methocarbamol (ROBAXIN) 500 MG tablet Take 1 tablet (500 mg total) by mouth 2 (two) times daily as needed for muscle spasms. 04/25/23   Jacalyn Lefevre, MD  permethrin (ELIMITE) 5 % cream Apply to affected area once 07/10/23   Virgina Norfolk, DO   sildenafil (VIAGRA) 50 MG tablet Take 50 mg by mouth daily as needed. 03/17/23   [provider]  traMADol (ULTRAM) 50 MG tablet Take 1 tablet (50 mg total) by mouth every 6 (six) hours as needed for moderate pain. 04/26/20   Burnadette Pop, MD                                                                                                                                    Past Surgical History History reviewed. No pertinent surgical history. Family History Family History  Problem Relation Age of Onset   CAD Father     Social History Social History   Tobacco Use   Smoking status: Former  Substance Use Topics   Alcohol use: Yes    Comment: 6 pack per week   Drug use: No   Allergies  Patient has no known allergies.  Review of Systems Review of Systems  All other systems reviewed and are negative.   Physical Exam Vital Signs  I have reviewed the triage vital signs BP (!) 122/91 (BP Location: Right Arm)   Pulse 97   Temp 98.3 F (36.8 C) (Oral)   Resp 18   Ht 6\' 3"  (1.905 m)   Wt 97.5 kg   SpO2 99%   BMI 26.87 kg/m  Physical Exam Vitals and nursing note reviewed.  Constitutional:      General: He is not in acute distress.    Appearance: Normal appearance.  HENT:     Head: Normocephalic and atraumatic.     Mouth/Throat:     Mouth: Mucous membranes are moist.  Eyes:     Conjunctiva/sclera: Conjunctivae normal.  Cardiovascular:     Rate and Rhythm: Normal rate.  Pulmonary:     Effort: Pulmonary effort is normal. No respiratory distress.  Abdominal:     General: Abdomen is flat.  Skin:    General: Skin is warm and dry.     Capillary Refill: Capillary refill takes less than 2 seconds.     Comments: Left inner thigh with approximately 0.5 x 0.5 cm wound approximately 0.5 cm deep.  Appears to be normal wound with no parasite or other organism present. No erythema, warmth, purulence, or other process.  Neurological:     General: No focal deficit present.      Mental Status: He is alert. Mental status is at baseline.  Psychiatric:        Mood and Affect: Mood normal.        Behavior: Behavior normal.     ED Results and Treatments Labs (all labs ordered are listed, but only abnormal results are displayed) Labs Reviewed - No data to display                                                                                                                        Radiology No results found.  Pertinent labs & imaging results that were available during my care of the patient were reviewed by me and considered in my medical decision making (see MDM for details).  Medications Ordered in ED Medications - No data to display  Procedures Procedures  (including critical care time)  Medical Decision Making / ED Course   MDM:  47 year old male presenting to the emergency department out of concern for infestation.  On exam, patient has a wound on his leg but no obvious signs of infection, abscess, parasite infection, organism, maggot, or other process.  The patient requested that I make a cut to open up his wound further and evaluate the parasite infestation.  I told him that this would not be advisable and he eloped.  Suspect patient may be having delusional parasitosis.  Did not have any evidence of any acute skin infection or other acute process on examination.       Additional history obtained:  -External records from outside source obtained and reviewed including: Chart review including previous notes, labs, imaging, consultation notes including ER visit from earlier today  Co morbidities that complicate the patient evaluation  Past Medical History:  Diagnosis Date   Anxiety    Hemorrhoids       Dispostion: Disposition decision including need for hospitalization was considered, and patient  Eloped    Final Clinical Impression(s) / ED Diagnoses Final diagnoses:  Delusions of parasitosis (HCC)  Eloped from emergency department     This chart was dictated using voice recognition software.  Despite best efforts to proofread,  errors can occur which can change the documentation meaning.    Lonell Grandchild, MD 07/10/23 1638    Lonell Grandchild, MD 07/10/23 1610    Lonell Grandchild, MD 07/10/23 1945

## 2023-07-11 DIAGNOSIS — R21 Rash and other nonspecific skin eruption: Secondary | ICD-10-CM | POA: Diagnosis not present

## 2023-07-11 DIAGNOSIS — L089 Local infection of the skin and subcutaneous tissue, unspecified: Secondary | ICD-10-CM | POA: Diagnosis not present

## 2023-07-13 DIAGNOSIS — T887XXA Unspecified adverse effect of drug or medicament, initial encounter: Secondary | ICD-10-CM | POA: Diagnosis not present

## 2023-07-13 DIAGNOSIS — L080 Pyoderma: Secondary | ICD-10-CM | POA: Diagnosis not present

## 2023-07-13 DIAGNOSIS — D485 Neoplasm of uncertain behavior of skin: Secondary | ICD-10-CM | POA: Diagnosis not present

## 2023-07-13 DIAGNOSIS — S80862A Insect bite (nonvenomous), left lower leg, initial encounter: Secondary | ICD-10-CM | POA: Diagnosis not present

## 2023-07-13 DIAGNOSIS — D2262 Melanocytic nevi of left upper limb, including shoulder: Secondary | ICD-10-CM | POA: Diagnosis not present

## 2023-07-13 DIAGNOSIS — S80861A Insect bite (nonvenomous), right lower leg, initial encounter: Secondary | ICD-10-CM | POA: Diagnosis not present

## 2023-07-13 DIAGNOSIS — S70362A Insect bite (nonvenomous), left thigh, initial encounter: Secondary | ICD-10-CM | POA: Diagnosis not present

## 2023-07-15 DIAGNOSIS — F1424 Cocaine dependence with cocaine-induced mood disorder: Secondary | ICD-10-CM | POA: Diagnosis not present

## 2023-08-02 DIAGNOSIS — F1424 Cocaine dependence with cocaine-induced mood disorder: Secondary | ICD-10-CM | POA: Diagnosis not present

## 2023-09-23 DIAGNOSIS — D485 Neoplasm of uncertain behavior of skin: Secondary | ICD-10-CM | POA: Diagnosis not present

## 2023-09-23 DIAGNOSIS — D225 Melanocytic nevi of trunk: Secondary | ICD-10-CM | POA: Diagnosis not present

## 2023-09-23 DIAGNOSIS — L905 Scar conditions and fibrosis of skin: Secondary | ICD-10-CM | POA: Diagnosis not present

## 2024-03-08 ENCOUNTER — Other Ambulatory Visit: Payer: Self-pay

## 2024-03-08 DIAGNOSIS — F411 Generalized anxiety disorder: Secondary | ICD-10-CM | POA: Diagnosis not present

## 2024-03-08 MED ORDER — ALPRAZOLAM 1 MG PO TABS
1.0000 mg | ORAL_TABLET | Freq: Every day | ORAL | 0 refills | Status: AC | PRN
  Filled 2024-03-08: qty 30, 30d supply, fill #0

## 2024-04-12 ENCOUNTER — Other Ambulatory Visit: Payer: Self-pay

## 2024-04-18 ENCOUNTER — Other Ambulatory Visit: Payer: Self-pay

## 2024-05-22 DIAGNOSIS — I10 Essential (primary) hypertension: Secondary | ICD-10-CM | POA: Diagnosis not present

## 2024-05-22 DIAGNOSIS — Z86711 Personal history of pulmonary embolism: Secondary | ICD-10-CM | POA: Diagnosis not present

## 2024-05-22 DIAGNOSIS — E7800 Pure hypercholesterolemia, unspecified: Secondary | ICD-10-CM | POA: Diagnosis not present

## 2024-05-22 DIAGNOSIS — Z0000 Encounter for general adult medical examination without abnormal findings: Secondary | ICD-10-CM | POA: Diagnosis not present

## 2024-05-22 DIAGNOSIS — F411 Generalized anxiety disorder: Secondary | ICD-10-CM | POA: Diagnosis not present
